# Patient Record
Sex: Female | Born: 1990 | Race: White | Hispanic: No | Marital: Single | Smoking: Never smoker
Health system: Southern US, Community
[De-identification: ages and names within clinical notes are randomized; demographics above are authoritative.]

## PROBLEM LIST (undated history)

## (undated) ENCOUNTER — Inpatient Hospital Stay: Payer: Self-pay

## (undated) DIAGNOSIS — I8290 Acute embolism and thrombosis of unspecified vein: Secondary | ICD-10-CM

## (undated) DIAGNOSIS — Z6841 Body Mass Index (BMI) 40.0 and over, adult: Secondary | ICD-10-CM

## (undated) DIAGNOSIS — A419 Sepsis, unspecified organism: Secondary | ICD-10-CM

## (undated) DIAGNOSIS — D649 Anemia, unspecified: Secondary | ICD-10-CM

## (undated) DIAGNOSIS — N390 Urinary tract infection, site not specified: Secondary | ICD-10-CM

## (undated) DIAGNOSIS — J45909 Unspecified asthma, uncomplicated: Secondary | ICD-10-CM

## (undated) HISTORY — DX: Unspecified asthma, uncomplicated: J45.909

## (undated) HISTORY — DX: Acute embolism and thrombosis of unspecified vein: I82.90

## (undated) HISTORY — PX: HAND SURGERY: SHX662

## (undated) HISTORY — DX: Urinary tract infection, site not specified: N39.0

## (undated) HISTORY — DX: Body Mass Index (BMI) 40.0 and over, adult: Z684

## (undated) HISTORY — DX: Sepsis, unspecified organism: A41.9

## (undated) HISTORY — DX: Anemia, unspecified: D64.9

---
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
## 2011-04-12 ENCOUNTER — Observation Stay: Payer: Self-pay

## 2011-05-05 ENCOUNTER — Encounter: Payer: Self-pay | Admitting: Obstetrics and Gynecology

## 2011-06-02 ENCOUNTER — Encounter: Payer: Self-pay | Admitting: Maternal & Fetal Medicine

## 2011-06-26 ENCOUNTER — Observation Stay: Payer: Self-pay | Admitting: Obstetrics and Gynecology

## 2011-07-06 ENCOUNTER — Observation Stay: Payer: Self-pay

## 2011-07-15 ENCOUNTER — Inpatient Hospital Stay: Payer: Self-pay | Admitting: Obstetrics and Gynecology

## 2011-07-19 ENCOUNTER — Observation Stay: Payer: Self-pay | Admitting: Emergency Medicine

## 2012-11-08 ENCOUNTER — Emergency Department: Payer: Self-pay | Admitting: Internal Medicine

## 2013-12-06 ENCOUNTER — Emergency Department: Payer: Self-pay | Admitting: Emergency Medicine

## 2013-12-06 LAB — URINALYSIS, COMPLETE
Bacteria: NONE SEEN
Bilirubin,UR: NEGATIVE
GLUCOSE, UR: NEGATIVE mg/dL (ref 0–75)
Nitrite: NEGATIVE
PH: 7 (ref 4.5–8.0)
Protein: 30
Specific Gravity: 1.012 (ref 1.003–1.030)

## 2013-12-06 LAB — COMPREHENSIVE METABOLIC PANEL
ALK PHOS: 53 U/L
ALT: 37 U/L (ref 12–78)
Albumin: 3.7 g/dL (ref 3.4–5.0)
Anion Gap: 5 — ABNORMAL LOW (ref 7–16)
BUN: 6 mg/dL — ABNORMAL LOW (ref 7–18)
Bilirubin,Total: 0.6 mg/dL (ref 0.2–1.0)
CHLORIDE: 104 mmol/L (ref 98–107)
CREATININE: 0.65 mg/dL (ref 0.60–1.30)
Calcium, Total: 9.3 mg/dL (ref 8.5–10.1)
Co2: 25 mmol/L (ref 21–32)
EGFR (African American): >60
EGFR (Non-African Amer.): >60
Glucose: 83 mg/dL (ref 65–99)
Osmolality: 265 (ref 275–301)
Potassium: 3.3 mmol/L — ABNORMAL LOW (ref 3.5–5.1)
SGOT(AST): 20 U/L (ref 15–37)
Sodium: 134 mmol/L — ABNORMAL LOW (ref 136–145)
TOTAL PROTEIN: 8.4 g/dL — AB (ref 6.4–8.2)

## 2013-12-06 LAB — CBC WITH DIFFERENTIAL/PLATELET
BASOS ABS: 0.1 10*3/uL (ref 0.0–0.1)
Basophil %: 0.6 %
Eosinophil #: 0.1 10*3/uL (ref 0.0–0.7)
Eosinophil %: 0.7 %
HCT: 40.8 % (ref 35.0–47.0)
HGB: 13.5 g/dL (ref 12.0–16.0)
LYMPHS ABS: 2.4 10*3/uL (ref 1.0–3.6)
Lymphocyte %: 23.1 %
MCH: 26.5 pg (ref 26.0–34.0)
MCHC: 33.2 g/dL (ref 32.0–36.0)
MCV: 80 fL (ref 80–100)
MONO ABS: 0.5 x10 3/mm (ref 0.2–0.9)
Monocyte %: 4.8 %
Neutrophil #: 7.3 10*3/uL — ABNORMAL HIGH (ref 1.4–6.5)
Neutrophil %: 70.8 %
Platelet: 254 10*3/uL (ref 150–440)
RBC: 5.1 10*6/uL (ref 3.80–5.20)
RDW: 14.7 % — AB (ref 11.5–14.5)
WBC: 10.4 10*3/uL (ref 3.6–11.0)

## 2013-12-06 LAB — HCG, QUANTITATIVE, PREGNANCY: BETA HCG, QUANT.: 32502 m[IU]/mL — AB

## 2013-12-06 LAB — LIPASE, BLOOD: Lipase: 97 U/L (ref 73–393)

## 2013-12-07 LAB — WET PREP, GENITAL

## 2013-12-07 LAB — GC/CHLAMYDIA PROBE AMP

## 2014-01-23 ENCOUNTER — Encounter: Payer: Self-pay | Admitting: Obstetrics and Gynecology

## 2014-03-06 ENCOUNTER — Encounter: Payer: Self-pay | Admitting: Obstetrics and Gynecology

## 2014-03-27 ENCOUNTER — Encounter: Payer: Self-pay | Admitting: Obstetrics and Gynecology

## 2014-04-13 ENCOUNTER — Observation Stay: Payer: Self-pay

## 2014-04-13 LAB — URINALYSIS, COMPLETE
Bilirubin,UR: NEGATIVE
Blood: NEGATIVE
Glucose,UR: NEGATIVE mg/dL (ref 0–75)
Nitrite: NEGATIVE
Ph: 6 (ref 4.5–8.0)
Protein: 25
RBC,UR: 7 /HPF (ref 0–5)
Specific Gravity: 1.015 (ref 1.003–1.030)
Squamous Epithelial: 6
WBC UR: 36 /HPF (ref 0–5)

## 2014-04-14 LAB — GC/CHLAMYDIA PROBE AMP

## 2014-04-15 LAB — URINE CULTURE

## 2014-05-16 ENCOUNTER — Observation Stay: Payer: Self-pay | Admitting: Obstetrics and Gynecology

## 2014-05-22 ENCOUNTER — Encounter: Payer: Self-pay | Admitting: Obstetrics & Gynecology

## 2014-06-09 ENCOUNTER — Observation Stay: Payer: Self-pay

## 2014-06-16 ENCOUNTER — Observation Stay: Payer: Self-pay

## 2014-06-23 ENCOUNTER — Observation Stay: Payer: Self-pay

## 2014-06-26 ENCOUNTER — Encounter: Payer: Self-pay | Admitting: Obstetrics and Gynecology

## 2014-06-26 ENCOUNTER — Observation Stay: Payer: Self-pay | Admitting: Certified Nurse Midwife

## 2014-06-26 LAB — URINALYSIS, COMPLETE
BILIRUBIN, UR: NEGATIVE
Bacteria: NONE SEEN
Blood: NEGATIVE
Glucose,UR: NEGATIVE mg/dL (ref 0–75)
KETONE: NEGATIVE
Leukocyte Esterase: NEGATIVE
NITRITE: NEGATIVE
Ph: 7 (ref 4.5–8.0)
Protein: NEGATIVE
Specific Gravity: 1.014 (ref 1.003–1.030)
Squamous Epithelial: 2

## 2014-06-26 LAB — GC/CHLAMYDIA PROBE AMP

## 2014-06-27 LAB — BETA STREP CULTURE(ARMC)

## 2014-06-30 ENCOUNTER — Observation Stay: Payer: Self-pay

## 2014-07-07 ENCOUNTER — Observation Stay: Payer: Self-pay

## 2014-07-14 ENCOUNTER — Observation Stay: Payer: Self-pay

## 2014-07-20 ENCOUNTER — Observation Stay: Payer: Self-pay | Admitting: Obstetrics and Gynecology

## 2014-07-21 ENCOUNTER — Inpatient Hospital Stay: Payer: Self-pay

## 2014-07-21 LAB — CBC WITH DIFFERENTIAL/PLATELET
BASOS ABS: 0 10*3/uL (ref 0.0–0.1)
Basophil %: 0.2 %
EOS ABS: 0 10*3/uL (ref 0.0–0.7)
Eosinophil %: 0 %
HCT: 34.7 % — ABNORMAL LOW (ref 35.0–47.0)
HGB: 11.2 g/dL — ABNORMAL LOW (ref 12.0–16.0)
Lymphocyte #: 1 10*3/uL (ref 1.0–3.6)
Lymphocyte %: 6.6 %
MCH: 26.5 pg (ref 26.0–34.0)
MCHC: 32.3 g/dL (ref 32.0–36.0)
MCV: 82 fL (ref 80–100)
Monocyte #: 0.5 x10 3/mm (ref 0.2–0.9)
Monocyte %: 3.1 %
Neutrophil #: 13.6 10*3/uL — ABNORMAL HIGH (ref 1.4–6.5)
Neutrophil %: 90.1 %
PLATELETS: 214 10*3/uL (ref 150–440)
RBC: 4.23 10*6/uL (ref 3.80–5.20)
RDW: 14.1 % (ref 11.5–14.5)
WBC: 15.1 10*3/uL — ABNORMAL HIGH (ref 3.6–11.0)

## 2014-07-21 LAB — GC/CHLAMYDIA PROBE AMP

## 2014-07-22 LAB — HEMATOCRIT: HCT: 31 % — AB (ref 35.0–47.0)

## 2014-12-23 NOTE — H&P (Signed)
L&D Evaluation:  History:  HPI -CC: fetal decel on dopplers at [facility] -HPI: 24 y/o G3P2002 [facility] here for above CC. Preg c/b elevated BMI, G2 c-section.  No decreased FM, UCs.   Patient's Surgical History Previous C-Section   Medications Pre [place]atal Vitamins   Allergies NKDA   Social History none   Family History no female cancers   Exam:  Vital Signs AF VS normal and stable   General no apparent distress   Mental Status clear   Chest ctab   Heart rrr no mrgs   Abdomen gravid, obese, soft, NTTP, non distended, +BS   Mebranes Intact   FHT 135 baseline, +accels, no decels, mod var   Ucx absent   Impression:  Impression patient and fetus doing well   Plan:  Comments *IUP: rNST -28wk labs done today *Decel: continue EFM for 30 more mins (90mins total) and if still looks great okay for d/c to home. Fetal kick count instructions given.  RTC [facility] 10/16 (pt told to keep appt)   Follow Up Appointment already scheduled   Electronic Signatures: [place] BingPickens, Rachel Clayton (MD)  (Signed 02-Oct-15 12:54)  Authored: L&D Evaluation   Last Updated: 02-Oct-15 12:54 by [place] BingPickens, Rachel Clayton (MD)

## 2014-12-23 NOTE — H&P (Signed)
L&D Evaluation:  History:  HPI 24 year old G3 P2002 with EDC=07/31/2014 by a 6wk2d ultrasound (and 08/05/2014 by LMP=10/29/2013-[facility] using this EDC?) presents at 24 3/7 weeks with c/o contractions since 4 PM. Contraction tightening and cramping less than 6x/hour. No bleeding or LOF. Denies vomiting, diarrhea, dysuria, vulvar itching or irritation. Also c/o constant mid thoracic (tenderness over spine).  Has not eaten all day. Drank 1 cup of water all day. "had to bail (her significant other) out of jail today and did not have time to eat. Baby moving better since she arrived on L&D. Denies any trauma precipitating back pain.  Prenatal care at [facility] remarkable for obesity with BMI>43, a UTI in April, asthma, and a hx of prior CS with G2 for failure of descent.   Presents with back pain, contractions   Patient's Medical History Asthma  Obesity   Patient's Surgical History Previous C-Section   Medications Pre Natal Vitamins   Allergies NKDA   Social History none  Last MJ in April   Family History Non-Contributory   ROS:  ROS see HPI   Exam:  Vital Signs stable  129/76   Urine Protein sp grav=1.015, +1 ketone, tr protein, 3+ leuks, 7 RBC/HPF, 36 WBC/HPF, 6epi cells   General no apparent distress   Mental Status clear   Abdomen gravid, non-tender, obese   Back no CVAT, some tenderness of paraspinal muscles in thoracic area   Pelvic cervix closed and thick, inflammed vulva and inner thighs and introitus. Wet prep positive for hyphae, negative for Trich or clue cells.  Cx: Closed/OOP   Mebranes Intact   FHT normal rate with no decels, FHTs 145   Ucx absent   Impression:  Impression IUP at 24 3/7 weeks with BH contractions.  Monilia. VV. Possible UTI   Plan:  Plan Urine culture pending. StaRT kEFLEX 500 MGM TID. dIFLUCAN 150 MGM Q 3 DAYS X 2.  Aptima sent.  encouraged to eat and drink fluids. FU at [facility] this week as scheduled.   Electronic Signatures: Trinna BalloonGutierrez, [place] Mielke L  (CNM)  (Signed 30-Aug-15 21:25)  Authored: L&D Evaluation   Last Updated: 30-Aug-15 21:25 by Trinna BalloonGutierrez, Kharis Lapenna L (CNM)

## 2014-12-23 NOTE — H&P (Signed)
L&D Evaluation:  History:  HPI Pt is a 24 yo G3P2002  at 8318w6d by an EDC of 08/05/14 by LMP=6wk2d ultrasound followed at [facility] who presents to L&D today after being sent from the office by the sonographer following her AFI because of her regular contractions that are increasing in frequency and because the "baby is in the birth canal."  She reports +FM, and bloody show. She denies lof.   The patient has been having weekly antenatal testing for BMI=43. Prenatal care at [facility] also remarkable for, a UTI in April/Sept/Oct/and November, asthma, leiomyoma, mild fatty liver,  and a hx of  a VAD with G1, and prior CS with G2 for failure of descent. Had oligo at 33-34 weeks but BPP of 10/10. She is A+, RI, VI, GBS positive, and received her Tdap and flu vaccine.   Presents with contractions   Patient's Medical History fatty liver, leiomyoma, UTI, asthma obesity   Patient's Surgical History Previous C-Section   Medications Pre Natal Vitamins   Allergies NKDA   Social History MJ use- last in april   Family History son with pulmonary stenosis   ROS:  ROS see HPI   Exam:  Vital Signs stable   Urine Protein 4+   General no apparent distress   Mental Status clear   Chest clear   Abdomen gravid, tender with contractions   Estimated Fetal Weight Average for gestational age   Fetal Position cephalic   Back no CVAT   Edema no edema   Pelvic no external lesions, 5/75/-2   Mebranes Intact   FHT 130's, moderate variability, +accels, 1-2 variables noted   FHT Description mod variability   Ucx regular, every 3-4   Skin dry, no lesions, no rashes   Lymph no lymphadenopathy   Impression:  Impression active labor, IUP at 37 6/7 weeks presenting in labor in the setting of prior C-section   Plan:  Plan EFM/NST, monitor contractions and for cervical change, antibiotics for GBBS prophylaxis   Follow Up Appointment need to schedule   Electronic Signatures: Jannet MantisSubudhi, Merlin Ege  (CNM)  (Signed 07-Dec-15 09:39)  Authored: L&D Evaluation   Last Updated: 07-Dec-15 09:39 by Jannet MantisSubudhi, Saria Haran (CNM)

## 2014-12-23 NOTE — H&P (Signed)
L&D Evaluation:  History:  HPI Pt is a 24 yo G3P2002  at 9312w5d by an EDC of 08/05/14 by LMP=6wk2d ultrasound followed at [facility] who presents to L&D at today for contractions starting this evening. No LOF, no VB, no ctx  The patient has been having weekly antenatal testing for BMI=43. Prenatal care at [facility] also remarkable for, a UTI in April/Sept/Oct/and November, asthma, leiomyoma, mild fatty liver,  and a hx of prior CS with G2 for failure of descent. Had oligo at 33-34 weeks but BPP of 10/10. She is A+, RI, VI,  and received her Tdap and flu vaccine.   Presents with contractions   Patient's Medical History fatty liver, leiomyoma, UTI, asthma obesity   Patient's Surgical History Previous C-Section   Medications Pre Natal Vitamins   Allergies NKDA   Social History MJ use- last in april   Family History son with pulmonary stenosis   ROS:  ROS see HPI   Exam:  Urine Protein 4+   General no apparent distress   Mental Status clear   Chest clear   Abdomen gravid, non-tender   Estimated Fetal Weight Average for gestational age   Fetal Position cephalic   Back no CVAT   Edema no edema   Pelvic no external lesions, 4cm per nursing check   Mebranes Intact   FHT normal rate with no decels   FHT Description mod variability   Ucx irregular, q5-68min   Skin dry   Impression:  Impression IUP at 37 5/7 weeks presenting for r/o labor in the setting of prior C-section   Plan:  Comments 1) R/O labor - recheck in 1-2hrs to see if any cervical change  2) Fetus - category I tracing  3) Disposition - pending cervical recheck   Electronic Signatures for Addendum Section:  Lorrene ReidStaebler, Tayvin Preslar M (MD) (Signed Addendum 06-Dec-15 19:07)  Patient actually expresses interest in attempting TOLAC.  She has a prior successfull vacuum assisted vaginal delivery of a 7lbs 8oz infant, followed by a LTCS for active phase arrest with Dr. Jean RosenthalJackson.  Given that she does have a tested pelvis,  this fetus is likely smaller I feel this a reasonable request.  We discussed that there is a 1% risk of uterine rupture with TOLAC necessitating emergent C-section with potential fetal compromise or harm.  Lorrene ReidStaebler, Vilma Will M (MD) (Signed Addendum 06-Dec-15 19:42)  Recheck 1.5hrs was 4/75/-3 posterior but stretchy.  Will keep patient for prolonged rule out   Electronic Signatures: Lorrene ReidStaebler, Camey Edell M (MD)  (Signed 06-Dec-15 18:24)  Authored: L&D Evaluation   Last Updated: 06-Dec-15 19:42 by Lorrene ReidStaebler, Jonpaul Lumm M (MD)

## 2014-12-23 NOTE — H&P (Signed)
L&D Evaluation:  History:  HPI Pt is a 24 yo G3P2002 at 34.[redacted] weeks GA with an EDC of 08/05/14 pt of [facility] who presents to L&D after her [facility] perinatal appointment for monitoring and delivery due to an AFI of 4 in the office today. She reports +FM, denies vb, lof or ctx. She denies any gush of fluid and reports last eating at 7:30am. Prenatal care at [facility] remarkable for obesity with BMI>43, a UTI in April, asthma, leiomyoma, and a hx of prior CS with G2 for failure of descent. She is A+, RI, VI, GBS unknown, and received her Tdap and flu vaccine.   Presents with other, oligohydramnios   Patient's Medical History fatty liver, leiomyoma, UTI, asthma obesity   Patient's Surgical History Previous C-Section   Medications Pre Natal Vitamins   Allergies NKDA   Social History MJ use- last in april   Family History Non-Contributory   ROS:  ROS All systems were reviewed.  HEENT, CNS, GI, GU, Respiratory, CV, Renal and Musculoskeletal systems were found to be normal.   Exam:  Vital Signs stable   General no apparent distress   Mental Status clear   Chest clear   Abdomen gravid, non-tender   Fetal Position cephalic   Back no CVAT   Mebranes Intact, neg ferning, neg nitrazine, neg pooling   FHT normal rate with no decels, 130, moderate variability, +accels, no decels   Ucx irregular   Skin dry, no lesions, no rashes   Lymph no lymphadenopathy   Impression:  Impression reactive NST, IUP at 34.2 oligohydramnios, morbid obesity, previous c/s   Plan:  Plan EFM/NST, fluids, BPP to be performed, probable repeat c/s for oligohydramnios and history of previous cesarean section   Comments BPP findings: 10/10, amniotic fluid pocket 3.12 x 2.75cm.   1L LR given Ucx sent GCCT sent On SSE, copious thick, green mucousy discharge - pH <4.5, no organisms seen on wet mount.  [facility] does not do NAAT for trichamonis.  Will ask to be collected and send at next office visit. Patient sees [facility]  for prenatal care, has appointment tomorrow and scheuduled NST Monday.  [facility] weekly [facility] for now.   Follow Up Appointment already scheduled   Electronic Signatures: Jannet MantisSubudhi, Valerya Maxton (CNM)  (Signed 12-Nov-15 10:54)  Authored: L&D Evaluation Ward, Elenora Fenderhelsea C (MD)  (Signed 12-Nov-15 13:22)  Authored: L&D Evaluation   Last Updated: 12-Nov-15 13:22 by Ward, Elenora Fenderhelsea C (MD)

## 2014-12-23 NOTE — H&P (Signed)
L&D Evaluation:  History:  HPI Pt is a 24 yo G3P2002  with an EDC of 08/05/14 by LMP=10/29/2013 and c/w 6wk2d ultrasound (EDC=12/17) and pt of [facility] who presents to L&D at 36.[redacted]weeks gestation for a NST/AFI. Having weekly antenatal testing for BMI=43. She reports +FM, denies vb, lof or ctx. Prenatal care at [facility] remarkable for obesity with BMI>43, a UTI in April/Sept/Oct/and November, asthma, leiomyoma, mild fatty liver,  and a hx of prior CS with G2 for failure of descent. Had oligo at 33-34 weeks but BPP of 10/10. She is A+, RI, VI,  and received her Tdap and flu vaccine.   Patient's Medical History fatty liver, leiomyoma, UTI, asthma obesity   Patient's Surgical History Previous C-Section   Medications Pre Natal Vitamins   Allergies NKDA   Social History MJ use- last in april   Family History son with pulmonary stenosis   ROS:  ROS see HPI   Exam:  Vital Signs 107/61   Urine Protein not completed   General no apparent distress   Abdomen gravid, non-tender   Fetal Position cephalic   Mebranes Intact, AFI=2.34+2.57+2.22cm=7.13cm   FHT normal rate with no decels, 135 baseline with accels to 170s   FHT Description mod variability   Ucx absent   Skin dry   Impression:  Impression IUP at 36 6/7 weeks with reactive NST and normaL afi   Plan:  Plan RTN in 1 week for NST/AFI. Repeat CS has been scheduled for 12/15. Daily FKCs   Electronic Signatures: Trinna BalloonGutierrez, Dashay Giesler L (CNM)  (Signed [phone]-Nov-15 23:03)  Authored: L&D Evaluation   Last Updated: 30-Nov-15 23:03 by Trinna BalloonGutierrez, Barbarann Kelly L (CNM)

## 2015-08-18 ENCOUNTER — Emergency Department
Admission: EM | Admit: 2015-08-18 | Discharge: 2015-08-18 | Disposition: A | Discharge status: Home / Self Care | Payer: Medicaid Other | Attending: Emergency Medicine | Admitting: Emergency Medicine

## 2015-08-18 ENCOUNTER — Encounter: Payer: Self-pay | Admitting: Emergency Medicine

## 2015-08-18 DIAGNOSIS — R51 Headache: Secondary | ICD-10-CM | POA: Diagnosis present

## 2015-08-18 DIAGNOSIS — N390 Urinary tract infection, site not specified: Secondary | ICD-10-CM | POA: Diagnosis not present

## 2015-08-18 DIAGNOSIS — Z3202 Encounter for pregnancy test, result negative: Secondary | ICD-10-CM | POA: Insufficient documentation

## 2015-08-18 DIAGNOSIS — R112 Nausea with vomiting, unspecified: Secondary | ICD-10-CM | POA: Insufficient documentation

## 2015-08-18 LAB — URINALYSIS COMPLETE WITH MICROSCOPIC (ARMC ONLY)
BILIRUBIN URINE: NEGATIVE
GLUCOSE, UA: NEGATIVE mg/dL
Hgb urine dipstick: NEGATIVE
NITRITE: NEGATIVE
PH: 6 (ref 5.0–8.0)
Protein, ur: 30 mg/dL — AB
Specific Gravity, Urine: 1.021 (ref 1.005–1.030)

## 2015-08-18 LAB — INFLUENZA PANEL BY PCR (TYPE A & B)
H1N1 flu by pcr: NOT DETECTED
Influenza A By PCR: NEGATIVE
Influenza B By PCR: NEGATIVE

## 2015-08-18 LAB — POCT PREGNANCY, URINE: Preg Test, Ur: NEGATIVE

## 2015-08-18 MED ORDER — IBUPROFEN 800 MG PO TABS
ORAL_TABLET | ORAL | Status: AC
  Filled 2015-08-18: qty 1

## 2015-08-18 MED ORDER — IBUPROFEN 800 MG PO TABS
800.0000 mg | ORAL_TABLET | Freq: Once | ORAL | Status: AC
  Administered 2015-08-18: 800 mg via ORAL

## 2015-08-18 MED ORDER — SULFAMETHOXAZOLE-TRIMETHOPRIM 800-160 MG PO TABS: 1.0000 | ORAL_TABLET | Freq: Two times a day (BID) | ORAL | Status: DC

## 2015-08-18 NOTE — ED Notes (Addendum)
States she developed body aches   Fever/chills  Vomited times 2  Since last pm also last period was in Nov.

## 2015-08-18 NOTE — Discharge Instructions (Signed)
[facility] any [facility]. Increase fluids.  Begin taking Bactrim DS twice a day for 10 days and you may take Tylenol or ibuprofen as needed for fever or muscle aches.

## 2015-08-18 NOTE — ED Notes (Signed)
Pt reports to ED w/ c/o n/v, headache and fever.  Pt sts that S/S began 3 days ago.  Pt sts that she has thrown up twice in last 48 hours

## 2015-08-18 NOTE — ED Provider Notes (Signed)
[facility] Emergency Department Provider Note  ____________________________________________  Time seen: Approximately 1:50 PM  I have reviewed the triage vital signs and the nursing notes.   HISTORY  Chief Complaint Influenza   HPI Rachel Clayton is a 25 y.o. female is here with complaint of headache, fever, body aches. She states this started 2 days ago and she has vomited twice in the last 48 hours. Patient states last time she vomited was approximately one hour ago when she drank some water. She also states that her last period was in November. She denies any abdominal pain, urinary symptoms, throat pain or cough. Currently she is not taking any over-the-counter medications. As her pain as 5 out of 10 at present. She is unaware of any exposure to influenza and has not traveled recently.   History reviewed. No pertinent past medical history.  There are no active problems to display for this patient.   History reviewed. No pertinent past surgical history.  Current Outpatient Rx  Name  Route  Sig  Dispense  Refill  . sulfamethoxazole-trimethoprim (BACTRIM DS,SEPTRA DS) 800-160 MG tablet   Oral   Take 1 tablet by mouth 2 (two) times daily.   20 tablet   0     Allergies Review of patient's allergies indicates no known allergies.  No family history on file.  Social History Social History  Substance Use Topics  . Smoking status: Never Smoker   . Smokeless tobacco: None  . Alcohol Use: No    Review of Systems Constitutional: Positive fever/chills Eyes: No visual changes. ENT: No sore throat. Cardiovascular: Denies chest pain. Respiratory: Denies shortness of breath. Gastrointestinal: No abdominal pain.  Positive nausea, positive vomiting.  No diarrhea.  No constipation. Genitourinary: Negative for dysuria. Musculoskeletal: Negative for back pain. Positive for muscle aches. Skin: Negative for rash. Neurological: Positive for headaches, no  focal weakness or numbness.  10-point ROS otherwise negative.  ____________________________________________   PHYSICAL EXAM:  VITAL SIGNS: ED Triage Vitals  Enc Vitals Group     BP 08/18/15 1323 132/94 mmHg     Pulse Rate 08/18/15 1323 120     Resp 08/18/15 1323 18     Temp 08/18/15 1323 100.9 F (38.3 C)     Temp Source 08/18/15 1323 Oral     SpO2 08/18/15 1323 98 %     Weight 08/18/15 1323 240 lb (108.863 kg)     Height 08/18/15 1323 5\' 6"  (1.676 m)     Head Cir --      Peak Flow --      Pain Score 08/18/15 1335 5     Pain Loc --      Pain Edu? --      Excl. in GC? --     Constitutional: Alert and oriented. Well appearing and in no acute distress. Eyes: Conjunctivae are normal. PERRL. EOMI. Head: Atraumatic. Nose: No congestion/rhinnorhea. EACs and TMs are clear bilaterally. Mouth/Throat: Mucous membranes are moist.  Oropharynx non-erythematous. Neck: No stridor.   Hematological/Lymphatic/Immunilogical: No cervical lymphadenopathy. Cardiovascular: Normal rate, regular rhythm. Grossly normal heart sounds.  Good peripheral circulation. Respiratory: Normal respiratory effort.  No retractions. Lungs CTAB. Gastrointestinal: Soft and nontender. No distention. No CVA tenderness. Musculoskeletal: Moves upper and lower extremities without any difficulty. Normal gait was noted. Neurologic:  Normal speech and language. No gross focal neurologic deficits are appreciated. No gait instability. Skin:  Skin is warm, dry and intact. No rash noted. Psychiatric: Mood and affect are normal. Speech  and behavior are normal.  ____________________________________________   LABS (all labs ordered are listed, but only abnormal results are displayed)  Labs Reviewed  URINALYSIS COMPLETEWITH MICROSCOPIC ([facility] ONLY) - Abnormal; Notable for the following:    Color, Urine Svara (*)    APPearance HAZY (*)    Ketones, ur 1+ (*)    Protein, ur 30 (*)    Leukocytes, UA TRACE (*)    Bacteria,  UA RARE (*)    Squamous Epithelial / LPF 6-30 (*)    All other components within normal limits  INFLUENZA PANEL BY PCR (TYPE A & B, H1N1)  POC URINE PREG, ED  POCT PREGNANCY, URINE    PROCEDURES  Procedure(s) performed: None  Critical Care performed: No  ____________________________________________   INITIAL IMPRESSION / ASSESSMENT AND PLAN / ED COURSE  Pertinent labs & imaging results that were available during my care of the patient were reviewed by me and considered in my medical decision making (see chart for details).  Patient was made aware that her influenza test was negative. She was given antipyretics while she was in the emergency room. Patient received prescription for Bactrim DS 1 twice a day for 10 days and encouraged to increase fluids for her urinary tract infection. ____________________________________________   FINAL CLINICAL IMPRESSION(S) / ED DIAGNOSES  Final diagnoses:  Acute urinary tract infection      Tommi RumpsRhonda L Karver Fadden, PA-C 08/18/15 1847  Jeanmarie PlantJames A McShane, MD 08/20/15 2017

## 2016-08-15 NOTE — L&D Delivery Note (Signed)
Delivery Note At 1:12 AM a viable female infant was delivered via Vaginal, Spontaneous (Presentation: ROA).  APGAR: 8, 9 ; weight pending.   Placenta status: delivered spontaneously, intact.  Cord: 3 VC with the following complications: none.  Cord pH: n/a  Anesthesia: epidural  Episiotomy:  none Lacerations:  none Suture Repair: n/a Est. Blood Loss (mL):  300 mL  Mom to postpartum.  Baby to Couplet care / Skin to Skin.  Called to see patient.  Mom pushed to deliver a viable female infant.  The head followed by shoulders, which delivered without difficulty, and the rest of the body.  A single nuchal cord noted and was delivered through and reduced at the perineum.  Baby to mom's chest.  Cord clamped and cut after > 1 min delay.  No cord blood obtained.  Placenta delivered spontaneously, intact, with a 3-vessel cord.  No vaginal, cervical, or perineal lacerations.  All counts correct.  Hemostasis obtained with IV pitocin and fundal massage. EBL 300 mL.     Thomasene MohairStephen Danarius Mcconathy, MD 07/16/2017, 1:21 AM

## 2016-11-24 ENCOUNTER — Emergency Department: Payer: Medicaid Other

## 2016-11-24 ENCOUNTER — Emergency Department
Admission: EM | Admit: 2016-11-24 | Discharge: 2016-11-24 | Disposition: A | Discharge status: Home / Self Care | Payer: Medicaid Other | Attending: Emergency Medicine | Admitting: Emergency Medicine

## 2016-11-24 DIAGNOSIS — O26891 Other specified pregnancy related conditions, first trimester: Secondary | ICD-10-CM | POA: Diagnosis present

## 2016-11-24 DIAGNOSIS — Z3A01 Less than 8 weeks gestation of pregnancy: Secondary | ICD-10-CM | POA: Insufficient documentation

## 2016-11-24 DIAGNOSIS — N390 Urinary tract infection, site not specified: Secondary | ICD-10-CM

## 2016-11-24 DIAGNOSIS — O2341 Unspecified infection of urinary tract in pregnancy, first trimester: Secondary | ICD-10-CM | POA: Insufficient documentation

## 2016-11-24 DIAGNOSIS — R1031 Right lower quadrant pain: Secondary | ICD-10-CM

## 2016-11-24 DIAGNOSIS — Z3491 Encounter for supervision of normal pregnancy, unspecified, first trimester: Secondary | ICD-10-CM

## 2016-11-24 DIAGNOSIS — R102 Pelvic and perineal pain: Secondary | ICD-10-CM | POA: Diagnosis not present

## 2016-11-24 LAB — COMPREHENSIVE METABOLIC PANEL
ALK PHOS: 53 U/L (ref 38–126)
ALT: 21 U/L (ref 14–54)
AST: 22 U/L (ref 15–41)
Albumin: 4.5 g/dL (ref 3.5–5.0)
Anion gap: 8 (ref 5–15)
BILIRUBIN TOTAL: 0.5 mg/dL (ref 0.3–1.2)
BUN: 8 mg/dL (ref 6–20)
CALCIUM: 9.8 mg/dL (ref 8.9–10.3)
CHLORIDE: 105 mmol/L (ref 101–111)
CO2: 23 mmol/L (ref 22–32)
CREATININE: 0.63 mg/dL (ref 0.44–1.00)
Glucose, Bld: 91 mg/dL (ref 65–99)
Potassium: 3.5 mmol/L (ref 3.5–5.1)
Sodium: 136 mmol/L (ref 135–145)
TOTAL PROTEIN: 9 g/dL — AB (ref 6.5–8.1)

## 2016-11-24 LAB — URINALYSIS, COMPLETE (UACMP) WITH MICROSCOPIC
BILIRUBIN URINE: NEGATIVE
Glucose, UA: NEGATIVE mg/dL
Hgb urine dipstick: NEGATIVE
Ketones, ur: NEGATIVE mg/dL
NITRITE: NEGATIVE
PH: 6 (ref 5.0–8.0)
Protein, ur: 30 mg/dL — AB
SPECIFIC GRAVITY, URINE: 1.029 (ref 1.005–1.030)

## 2016-11-24 LAB — CBC
HCT: 42.2 % (ref 35.0–47.0)
Hemoglobin: 14.3 g/dL (ref 12.0–16.0)
MCH: 26.1 pg (ref 26.0–34.0)
MCHC: 33.8 g/dL (ref 32.0–36.0)
MCV: 77.2 fL — AB (ref 80.0–100.0)
Platelets: 287 10*3/uL (ref 150–440)
RBC: 5.46 MIL/uL — AB (ref 3.80–5.20)
RDW: 15.3 % — AB (ref 11.5–14.5)
WBC: 9.5 10*3/uL (ref 3.6–11.0)

## 2016-11-24 LAB — LIPASE, BLOOD: LIPASE: 21 U/L (ref 11–51)

## 2016-11-24 LAB — HCG, QUANTITATIVE, PREGNANCY: HCG, BETA CHAIN, QUANT, S: 29366 m[IU]/mL — AB (ref <5)

## 2016-11-24 MED ORDER — ACETAMINOPHEN 500 MG PO TABS
1000.0000 mg | ORAL_TABLET | Freq: Once | ORAL | Status: AC
  Administered 2016-11-24: 1000 mg via ORAL
  Filled 2016-11-24: qty 2

## 2016-11-24 MED ORDER — PRENATAL VITAMINS 0.8 MG PO TABS: 1.0000 | ORAL_TABLET | Freq: Every day | ORAL | 3 refills | Status: DC

## 2016-11-24 MED ORDER — NITROFURANTOIN MACROCRYSTAL 100 MG PO CAPS: 100.0000 mg | ORAL_CAPSULE | Freq: Two times a day (BID) | ORAL | 0 refills | Status: DC

## 2016-11-24 MED ORDER — ONDANSETRON 4 MG PO TBDP: 8.0000 mg | ORAL_TABLET | Freq: Once | ORAL | Status: DC

## 2016-11-24 MED ORDER — ONDANSETRON HCL 4 MG/2ML IJ SOLN
4.0000 mg | Freq: Once | INTRAMUSCULAR | Status: AC
  Administered 2016-11-24: 4 mg via INTRAVENOUS
  Filled 2016-11-24: qty 2

## 2016-11-24 MED ORDER — SODIUM CHLORIDE 0.9 % IV BOLUS (SEPSIS)
1000.0000 mL | Freq: Once | INTRAVENOUS | Status: AC
  Administered 2016-11-24: 1000 mL via INTRAVENOUS

## 2016-11-24 NOTE — Discharge Instructions (Signed)
Your MRI was unremarkable. Your urine test suggests that you may have a mild urinary tract infection. Take Macrobid to treat this. Follow up with primary care.

## 2016-11-24 NOTE — ED Notes (Signed)
Patient transported to MRI 

## 2016-11-24 NOTE — ED Triage Notes (Signed)
Pt c/o RLQ pain with N/V/D since yesterday. Pt states she is [redacted] weeks pregnant with a confirmed pregnancy test from the health department .

## 2016-11-24 NOTE — ED Provider Notes (Signed)
[facility] Emergency Department Provider Note  ____________________________________________  Time seen: Approximately 6:54 PM  I have reviewed the triage vital signs and the nursing notes.   HISTORY  Chief Complaint Abdominal Pain    HPI Rachel Clayton is a 26 y.o. female who complains of right lower quadrant abdominal pain with nausea vomiting diarrhea since yesterday. She reports that she is [redacted] weeks pregnant with a last menstrual period of 11/13/2016. She had a positive urine pregnancy test with the health Department earlier today according to the patient. Reports decreased appetite in the last day or 2 as well. Nausea. No diarrhea or constipation. No fever or chills. No prior surgeries. No prenatal care or prenatal vitamins yet.  Patient denies vaginal bleeding or discharge.   History reviewed. No pertinent past medical history.   There are no active problems to display for this patient.    Past Surgical History:  Procedure Laterality Date  . CESAREAN SECTION       Prior to Admission medications   Medication Sig Start Date End Date Taking? Authorizing Provider  nitrofurantoin (MACRODANTIN) 100 MG capsule Take 1 capsule (100 mg total) by mouth 2 (two) times daily. 11/24/16   Sharman Cheek, MD  Prenatal Multivit-Min-Fe-FA (PRENATAL VITAMINS) 0.8 MG tablet Take 1 tablet by mouth daily. 11/24/16   Sharman Cheek, MD  sulfamethoxazole-trimethoprim (BACTRIM DS,SEPTRA DS) 800-160 MG tablet Take 1 tablet by mouth 2 (two) times daily. 08/18/15   Tommi Rumps, PA-C     Allergies Patient has no known allergies.   No family history on file.  Social History Social History  Substance Use Topics  . Smoking status: Never Smoker  . Smokeless tobacco: Never Used  . Alcohol use No    Review of Systems  Constitutional:   No fever or chills.  ENT:   No sore throat. No rhinorrhea. Cardiovascular:   No chest pain. Respiratory:   No dyspnea or  cough. Gastrointestinal:   Right lower quadrant abdominal pain as above with vomiting and diarrhea Genitourinary:   Negative for dysuria or difficulty urinating. Musculoskeletal:   Negative for focal pain or swelling Neurological:   Negative for headaches 10-point ROS otherwise negative.  ____________________________________________   PHYSICAL EXAM:  VITAL SIGNS: ED Triage Vitals  Enc Vitals Group     BP 11/24/16 1459 (!) 150/100     Pulse Rate 11/24/16 1459 94     Resp 11/24/16 1459 18     Temp 11/24/16 1459 97.7 F (36.5 C)     Temp Source 11/24/16 1459 Oral     SpO2 11/24/16 1459 100 %     Weight 11/24/16 1500 247 lb (112 kg)     Height 11/24/16 1500  (1.575 m)     Head Circumference --      Peak Flow --      Pain Score 11/24/16 1459 5     Pain Loc --      Pain Edu? --      Excl. in GC? --     Vital signs reviewed, nursing assessments reviewed.   Constitutional:   Alert and oriented. Well appearing and in no distress. Eyes:   No scleral icterus. No conjunctival pallor. PERRL. EOMI.  No nystagmus. ENT   Head:   Normocephalic and atraumatic.   Nose:   No congestion/rhinnorhea. No septal hematoma   Mouth/Throat:   MMM, no pharyngeal erythema. No peritonsillar mass.    Neck:   No stridor. No SubQ emphysema. No  meningismus. Hematological/Lymphatic/Immunilogical:   No cervical lymphadenopathy. Cardiovascular:   RRR. Symmetric bilateral radial and DP pulses.  No murmurs.  Respiratory:   Normal respiratory effort without tachypnea nor retractions. Breath sounds are clear and equal bilaterally. No wheezes/rales/rhonchi. Gastrointestinal:   Soft With suprapubic and right lower quadrant tenderness, more pronounced at McBurney's point.. Non distended. There is no CVA tenderness.  No rebound, rigidity, or guarding. Genitourinary:   deferred Musculoskeletal:   Normal range of motion in all extremities. No joint effusions.  No lower extremity tenderness.  No  edema. Neurologic:   Normal speech and language.  CN 2-10 normal. Motor grossly intact. No gross focal neurologic deficits are appreciated.  Skin:    Skin is warm, dry and intact. No rash noted.  No petechiae, purpura, or bullae.  ____________________________________________    LABS (pertinent positives/negatives) (all labs ordered are listed, but only abnormal results are displayed) Labs Reviewed  COMPREHENSIVE METABOLIC PANEL - Abnormal; Notable for the following:       Result Value   Total Protein 9.0 (*)    All other components within normal limits  CBC - Abnormal; Notable for the following:    RBC 5.46 (*)    MCV 77.2 (*)    RDW 15.3 (*)    All other components within normal limits  URINALYSIS, COMPLETE (UACMP) WITH MICROSCOPIC - Abnormal; Notable for the following:    Color, Urine YELLOW (*)    APPearance CLOUDY (*)    Protein, ur 30 (*)    Leukocytes, UA SMALL (*)    Bacteria, UA RARE (*)    Squamous Epithelial / LPF 6-30 (*)    All other components within normal limits  HCG, QUANTITATIVE, PREGNANCY - Abnormal; Notable for the following:    hCG, Beta Chain, Quant, S 29,366 (*)    All other components within normal limits  URINE CULTURE  LIPASE, BLOOD   ____________________________________________   EKG    ____________________________________________    RADIOLOGY  Mr Pelvis Wo Contrast  Result Date: 11/24/2016 CLINICAL DATA:  Right lower quadrant pain with nausea vomiting and diarrhea since yesterday. Patient has confirmed pregnancy. EXAM: MRI ABDOMEN AND PELVIS WITHOUT CONTRAST TECHNIQUE: Multiplanar multisequence MR imaging of the abdomen and pelvis was performed. No intravenous contrast was administered. COMPARISON:  None. FINDINGS: COMBINED FINDINGS FOR BOTH MR ABDOMEN AND PELVIS Lower chest: Unremarkable. Hepatobiliary: Visualized portions of the liver are normal. Gallbladder has normal MR imaging appearance with some layering sludge in the lumen. No  intra or extrahepatic biliary duct dilatation. Pancreas: No focal mass lesion. No dilatation of the main duct. No intraparenchymal cyst. No peripancreatic edema. Spleen:  No splenomegaly. No focal mass lesion. Adrenals/Urinary Tract: No adrenal nodule or mass. 9 mm well-defined T1 hypointense, T2 hypointense cortical lesion upper pole left kidney cannot be definitively characterized but in a patient of this age is most likely a cyst complicated by proteinaceous debris or hemorrhage. There is no hydronephrosis in either kidney. No evidence for hydroureter. Stomach/Bowel: Stomach is nondistended. No gastric wall thickening. No evidence of outlet obstruction. Duodenum is normally positioned as is the ligament of Treitz. No small bowel dilatation. The terminal ileum is normal. The appendix is well-visualized and is normal. No gross colonic mass. No colonic wall thickening. No substantial diverticular change. Vascular/Lymphatic: No abdominal aortic aneurysm.There is no gastrohepatic or hepatoduodenal ligament lymphadenopathy. No intraperitoneal or retroperitoneal lymphadenopathy. No pelvic sidewall lymphadenopathy. Reproductive: Intrauterine gestational sac is visualized. 18 mm follicle or corpus luteum cyst noted left ovary. Right  ovary normal in appearance. Other:  Trace free fluid is seen in the cul-de-sac. Musculoskeletal: No abnormal marrow signal identified within the visualized bony anatomy. IMPRESSION: 1. No acute findings in the abdomen or pelvis. Specifically, the appendix is normal. Terminal ileum is normal. No right adnexal mass. 2. Very trace intraperitoneal free fluid noted in the cul-de-sac. Electronically Signed   By: Kennith Center M.D.   On: 11/24/2016 17:43   Mr Abdomen Wo Contrast  Result Date: 11/24/2016 CLINICAL DATA:  Right lower quadrant pain with nausea vomiting and diarrhea since yesterday. Patient has confirmed pregnancy. EXAM: MRI ABDOMEN AND PELVIS WITHOUT CONTRAST TECHNIQUE: Multiplanar  multisequence MR imaging of the abdomen and pelvis was performed. No intravenous contrast was administered. COMPARISON:  None. FINDINGS: COMBINED FINDINGS FOR BOTH MR ABDOMEN AND PELVIS Lower chest: Unremarkable. Hepatobiliary: Visualized portions of the liver are normal. Gallbladder has normal MR imaging appearance with some layering sludge in the lumen. No intra or extrahepatic biliary duct dilatation. Pancreas: No focal mass lesion. No dilatation of the main duct. No intraparenchymal cyst. No peripancreatic edema. Spleen:  No splenomegaly. No focal mass lesion. Adrenals/Urinary Tract: No adrenal nodule or mass. 9 mm well-defined T1 hypointense, T2 hypointense cortical lesion upper pole left kidney cannot be definitively characterized but in a patient of this age is most likely a cyst complicated by proteinaceous debris or hemorrhage. There is no hydronephrosis in either kidney. No evidence for hydroureter. Stomach/Bowel: Stomach is nondistended. No gastric wall thickening. No evidence of outlet obstruction. Duodenum is normally positioned as is the ligament of Treitz. No small bowel dilatation. The terminal ileum is normal. The appendix is well-visualized and is normal. No gross colonic mass. No colonic wall thickening. No substantial diverticular change. Vascular/Lymphatic: No abdominal aortic aneurysm.There is no gastrohepatic or hepatoduodenal ligament lymphadenopathy. No intraperitoneal or retroperitoneal lymphadenopathy. No pelvic sidewall lymphadenopathy. Reproductive: Intrauterine gestational sac is visualized. 18 mm follicle or corpus luteum cyst noted left ovary. Right ovary normal in appearance. Other:  Trace free fluid is seen in the cul-de-sac. Musculoskeletal: No abnormal marrow signal identified within the visualized bony anatomy. IMPRESSION: 1. No acute findings in the abdomen or pelvis. Specifically, the appendix is normal. Terminal ileum is normal. No right adnexal mass. 2. Very trace  intraperitoneal free fluid noted in the cul-de-sac. Electronically Signed   By: Kennith Center M.D.   On: 11/24/2016 17:43    ____________________________________________   PROCEDURES Procedures  ____________________________________________   INITIAL IMPRESSION / ASSESSMENT AND PLAN / ED COURSE  Pertinent labs & imaging results that were available during my care of the patient were reviewed by me and considered in my medical decision making (see chart for details).  Patient well appearing no acute distress, presents with right lower quadrant abdominal pain in very early pregnancy. Symptoms and urinalysis are consistent with urinary tract infection, but appendicitis is also on the dura fragile. Due to early pregnancy, an MRI was performed which was negative. We'll treat with Macrobid for 1 week, urine culture sent. Follow-up with primary care per gestational she'll follow-up with [facility] soon. Also counseled on prenatal vitamins. Low suspicion for STI or PID TOA or torsion.         ____________________________________________   FINAL CLINICAL IMPRESSION(S) / ED DIAGNOSES  Final diagnoses:  Abdominal pain, RLQ  Pregnant and not yet delivered in first trimester  Lower urinary tract infectious disease      New Prescriptions   NITROFURANTOIN (MACRODANTIN) 100 MG CAPSULE    Take 1 capsule (  100 mg total) by mouth 2 (two) times daily.   PRENATAL MULTIVIT-MIN-FE-FA (PRENATAL VITAMINS) 0.8 MG TABLET    Take 1 tablet by mouth daily.     Portions of this note were generated with dragon dictation software. Dictation errors may occur despite best attempts at proofreading.    Sharman Cheek, MD 11/24/16 [id number]

## 2016-11-26 LAB — URINE CULTURE

## 2016-12-14 ENCOUNTER — Ambulatory Visit (INDEPENDENT_AMBULATORY_CARE_PROVIDER_SITE_OTHER): Payer: Medicaid Other | Admitting: Advanced Practice Midwife

## 2016-12-14 ENCOUNTER — Encounter: Payer: Self-pay | Admitting: Advanced Practice Midwife

## 2016-12-14 VITALS — BP 120/80 | HR 98 | Wt 237.0 lb

## 2016-12-14 DIAGNOSIS — Z113 Encounter for screening for infections with a predominantly sexual mode of transmission: Secondary | ICD-10-CM

## 2016-12-14 DIAGNOSIS — O0990 Supervision of high risk pregnancy, unspecified, unspecified trimester: Secondary | ICD-10-CM | POA: Insufficient documentation

## 2016-12-14 DIAGNOSIS — B3731 Acute candidiasis of vulva and vagina: Secondary | ICD-10-CM

## 2016-12-14 DIAGNOSIS — E669 Obesity, unspecified: Secondary | ICD-10-CM

## 2016-12-14 DIAGNOSIS — Z124 Encounter for screening for malignant neoplasm of cervix: Secondary | ICD-10-CM

## 2016-12-14 DIAGNOSIS — Z3490 Encounter for supervision of normal pregnancy, unspecified, unspecified trimester: Secondary | ICD-10-CM

## 2016-12-14 DIAGNOSIS — IMO0001 Reserved for inherently not codable concepts without codable children: Secondary | ICD-10-CM

## 2016-12-14 DIAGNOSIS — B373 Candidiasis of vulva and vagina: Secondary | ICD-10-CM

## 2016-12-14 DIAGNOSIS — Z6841 Body Mass Index (BMI) 40.0 and over, adult: Secondary | ICD-10-CM

## 2016-12-14 DIAGNOSIS — Z1379 Encounter for other screening for genetic and chromosomal anomalies: Secondary | ICD-10-CM

## 2016-12-14 DIAGNOSIS — Z131 Encounter for screening for diabetes mellitus: Secondary | ICD-10-CM

## 2016-12-14 MED ORDER — FLUCONAZOLE 150 MG PO TABS: 150.0000 mg | ORAL_TABLET | Freq: Once | ORAL | 0 refills | Status: AC

## 2016-12-14 NOTE — Progress Notes (Signed)
New Obstetric Patient H&P    Chief Complaint: "Desires prenatal care"    History of Present Illness: Patient is a 26 y.o. G4P3 Not Hispanic or Latino female, LMP 11/09/2016 presents with amenorrhea and positive home pregnancy test. Based on her  LMP, her EDD is Estimated Date of Delivery: 08/16/17 and her EGA is [redacted]w[redacted]d. Cycles are 5. days, regular, and occur approximately every : 28 days. Her last pap smear was 1 years ago and was no abnormalities.    She had a urine pregnancy test which was positive 2 week(s)  ago. Her last menstrual period was normal and lasted for  5 day(s). Since her LMP she claims she has experienced breast tenderness, fatigue, nausea and vomiting. She denies vaginal bleeding. Her past medical history is noncontributory. Her prior pregnancies are notable for G1 SVD, G2 C/S, G3 VBAC  Since her LMP, she admits to the use of tobacco products  no She claims she has lost 8 pounds since the start of her pregnancy.  There are cats in the home in the home  no  She admits close contact with children on a regular basis  yes  She has had chicken pox in the past yes She has had Tuberculosis exposures, symptoms, or previously tested positive for TB   no Current or past history of domestic violence. no  Genetic Screening/Teratology Counseling: (Includes patient, baby's father, or anyone in either family with:)   1. Patient's age >/= 79 at [facility]  no 2. Thalassemia ([place], [place], Mediterranean, or Asian background): MCV<80  no 3. Neural tube defect (meningomyelocele, spina bifida, anencephaly)  no 4. Congenital heart defect  no  5. Down syndrome  no 6. Tay-Sachs (Jewish, [place]  no 7. Canavan's Disease  no 8. Sickle cell disease or trait (African)  no  9. Hemophilia or other blood disorders  no  10. Muscular dystrophy  no  11. Cystic fibrosis  no  12. Huntington's Chorea  no  13. Mental retardation/autism  no 14. Other inherited genetic or chromosomal disorder   no 15. Maternal metabolic disorder (DM, PKU, etc)  no 16. Patient or FOB with a child with a birth defect not listed above no  16a. Patient or FOB with a birth defect themselves no 17. Recurrent pregnancy loss, or stillbirth  no  18. Any medications since LMP other than prenatal vitamins (include vitamins, supplements, OTC meds, drugs, alcohol)  no 19. Any other genetic/environmental exposure to discuss  no  Infection History:   1. Lives with someone with TB or TB exposed  no  2. Patient or partner has history of genital herpes  no 3. Rash or viral illness since LMP  no 4. History of STI (GC, CT, HPV, syphilis, HIV)  no 5. History of recent travel :  no  Other pertinent information:  no     Review of Systems:10 point review of systems negative unless otherwise noted in HPI  Past Medical History:  Past Medical History:  Diagnosis Date  . Asthma   . BMI 40.0-44.9, adult (HCC)   . History of oligohydramnios   . Recurrent UTI     Past Surgical History:  Past Surgical History:  Procedure Laterality Date  . CESAREAN SECTION    . HAND SURGERY      Gynecologic History: Patient's last menstrual period was 11/09/2016.  Obstetric History: G4P3  Family History:  Family History  Problem Relation Age of Onset  . Hypertension Mother  Social History:  Social History   Social History  . Marital status: Single    Spouse name: N/A  . Number of children: N/A  . Years of education: N/A   Occupational History  . Not on file.   Social History Main Topics  . Smoking status: Never Smoker  . Smokeless tobacco: Never Used  . Alcohol use No  . Drug use: No  . Sexual activity: Yes   Other Topics Concern  . Not on file   Social History Narrative  . No narrative on file    Allergies:  No Known Allergies  Medications: Prior to Admission medications   Medication Sig Start Date End Date Taking? Authorizing Provider  fluconazole (DIFLUCAN) 150 MG tablet Take 1 tablet  (150 mg total) by mouth once. Can take additional dose three days later if symptoms persist 12/14/16 12/14/16  Tresea Mall, CNM    Physical Exam Vitals: Blood pressure 120/80, pulse 98, weight 237 lb (107.5 kg), last menstrual period 11/09/2016.  General: NAD HEENT: normocephalic, anicteric Thyroid: no enlargement, no palpable nodules Pulmonary: No increased work of breathing, CTAB Cardiovascular: RRR, distal pulses 2+ Abdomen: NABS, soft, non-tender, non-distended.  Umbilicus without lesions.  No hepatomegaly, splenomegaly or masses palpable. No evidence of hernia  Genitourinary:  External: Normal external female genitalia.  Normal urethral meatus, normal  Bartholin's and Skene's glands.    Vagina: Normal vaginal mucosa, no evidence of prolapse.    Cervix: Grossly normal in appearance, no bleeding, no CMT  Uterus: Enlarged, mobile, normal contour.    Adnexa: ovaries non-enlarged, no adnexal masses  Rectal: deferred Extremities: no edema, erythema, or tenderness Neurologic: Grossly intact Psychiatric: mood appropriate, affect full   Assessment: 26 y.o. G4P3 at [redacted]w[redacted]d presenting to initiate prenatal care  Plan: 1) Avoid alcoholic beverages. 2) Patient encouraged not to smoke.  3) Discontinue the use of all non-medicinal drugs and chemicals.  4) Take prenatal vitamins daily.  5) Nutrition, food safety (fish, cheese advisories, and high nitrite foods) and exercise discussed. 6) Hospital and practice style discussed with cross coverage system.  7) Genetic Screening, such as with 1st Trimester Screening, cell free fetal DNA, AFP testing, and Ultrasound, as well as with amniocentesis and CVS as appropriate, is discussed with patient. At the conclusion of today's visit patient declined genetic testing 8) Patient is asked about travel to areas at risk for the [place] virus, and counseled to avoid travel and exposure to mosquitoes or sexual partners who may have themselves been exposed to the  virus. Testing is discussed, and will be ordered as appropriate.    Tresea Mall, CNM

## 2016-12-14 NOTE — Patient Instructions (Signed)

## 2016-12-16 LAB — IGP,CTNGTV,RFX APTIMA HPV ASCU
Chlamydia, Nuc. Acid Amp: NEGATIVE
GONOCOCCUS, NUC. ACID AMP: NEGATIVE
PAP SMEAR COMMENT: 0
Trich vag by NAA: NEGATIVE

## 2016-12-16 LAB — URINE CULTURE

## 2016-12-30 ENCOUNTER — Ambulatory Visit (INDEPENDENT_AMBULATORY_CARE_PROVIDER_SITE_OTHER): Payer: Medicaid Other

## 2016-12-30 DIAGNOSIS — O0990 Supervision of high risk pregnancy, unspecified, unspecified trimester: Secondary | ICD-10-CM

## 2016-12-30 DIAGNOSIS — Z3490 Encounter for supervision of normal pregnancy, unspecified, unspecified trimester: Secondary | ICD-10-CM | POA: Diagnosis not present

## 2017-01-04 ENCOUNTER — Ambulatory Visit (INDEPENDENT_AMBULATORY_CARE_PROVIDER_SITE_OTHER): Payer: Medicaid Other | Admitting: Obstetrics and Gynecology

## 2017-01-04 ENCOUNTER — Other Ambulatory Visit: Payer: Medicaid Other

## 2017-01-04 VITALS — BP 114/68 | Wt 235.0 lb

## 2017-01-04 DIAGNOSIS — O0990 Supervision of high risk pregnancy, unspecified, unspecified trimester: Secondary | ICD-10-CM

## 2017-01-04 DIAGNOSIS — Z3A12 12 weeks gestation of pregnancy: Secondary | ICD-10-CM

## 2017-01-04 DIAGNOSIS — O34219 Maternal care for unspecified type scar from previous cesarean delivery: Secondary | ICD-10-CM

## 2017-01-04 DIAGNOSIS — O99210 Obesity complicating pregnancy, unspecified trimester: Secondary | ICD-10-CM

## 2017-01-04 NOTE — Patient Instructions (Signed)
Start low dose ASA at >[redacted] weeks gestation as per USPTF recommendation "Low-Dose Aspirin Use for the Prevention of Morbidity and Mortality From Preeclampsia: Preventive Medicine"  furthermore endorsed by ACOG, WHO, and NIH based on evidence level B for the prevention of preeclampsia  In women deemed high risk  (diabetes, renal disease, chronic hypertension, history of preeclampsia in prior gestation, autoimmune diseases, or multifetal gestations)  

## 2017-01-04 NOTE — Progress Notes (Signed)
Dating scan on 5/18

## 2017-01-05 LAB — COMPREHENSIVE METABOLIC PANEL
ALT: 14 IU/L (ref 0–32)
AST: 14 IU/L (ref 0–40)
Albumin/Globulin Ratio: 1.3 (ref 1.2–2.2)
Albumin: 4 g/dL (ref 3.5–5.5)
Alkaline Phosphatase: 49 IU/L (ref 39–117)
BUN/Creatinine Ratio: 9 (ref 9–23)
BUN: 5 mg/dL — AB (ref 6–20)
Bilirubin Total: 0.4 mg/dL (ref 0.0–1.2)
CALCIUM: 9.2 mg/dL (ref 8.7–10.2)
CO2: 19 mmol/L (ref 18–29)
Chloride: 100 mmol/L (ref 96–106)
Creatinine, Ser: 0.53 mg/dL — ABNORMAL LOW (ref 0.57–1.00)
GFR, EST AFRICAN AMERICAN: 154 mL/min/{1.73_m2} (ref >59)
GFR, EST NON AFRICAN AMERICAN: 133 mL/min/{1.73_m2} (ref >59)
GLUCOSE: 87 mg/dL (ref 65–99)
Globulin, Total: 3.2 g/dL (ref 1.5–4.5)
Potassium: 4 mmol/L (ref 3.5–5.2)
Sodium: 135 mmol/L (ref 134–144)
TOTAL PROTEIN: 7.2 g/dL (ref 6.0–8.5)

## 2017-01-05 LAB — RPR+RH+ABO+RUB AB+AB SCR+CB...
Antibody Screen: NEGATIVE
HEMATOCRIT: 37.6 % (ref 34.0–46.6)
HEP B S AG: NEGATIVE
HIV SCREEN 4TH GENERATION: NONREACTIVE
Hemoglobin: 12.7 g/dL (ref 11.1–15.9)
MCH: 27.3 pg (ref 26.6–33.0)
MCHC: 33.8 g/dL (ref 31.5–35.7)
MCV: 81 fL (ref 79–97)
Platelets: 248 10*3/uL (ref 150–379)
RBC: 4.65 x10E6/uL (ref 3.77–5.28)
RDW: 16.7 % — ABNORMAL HIGH (ref 12.3–15.4)
RH TYPE: POSITIVE
RPR Ser Ql: NONREACTIVE
Rubella Antibodies, IGG: <0.9 index — ABNORMAL LOW (ref >0.99)
VARICELLA: 657 {index} (ref >165)
WBC: 7.3 10*3/uL (ref 3.4–10.8)

## 2017-01-05 LAB — HEMOGLOBIN A1C
Est. average glucose Bld gHb Est-mCnc: 97 mg/dL
HEMOGLOBIN A1C: 5 % (ref 4.8–5.6)

## 2017-01-05 LAB — TSH: TSH: 2.47 u[IU]/mL (ref 0.450–4.500)

## 2017-01-05 LAB — PROTEIN / CREATININE RATIO, URINE
Creatinine, Urine: 201.6 mg/dL
PROTEIN UR: 29.6 mg/dL
Protein/Creat Ratio: 147 mg/g creat (ref 0–200)

## 2017-02-01 ENCOUNTER — Other Ambulatory Visit: Payer: Medicaid Other

## 2017-02-01 ENCOUNTER — Ambulatory Visit (INDEPENDENT_AMBULATORY_CARE_PROVIDER_SITE_OTHER): Payer: Medicaid Other | Admitting: Obstetrics and Gynecology

## 2017-02-01 VITALS — BP 124/70 | Wt 241.0 lb

## 2017-02-01 DIAGNOSIS — R7309 Other abnormal glucose: Secondary | ICD-10-CM

## 2017-02-01 DIAGNOSIS — O34219 Maternal care for unspecified type scar from previous cesarean delivery: Secondary | ICD-10-CM

## 2017-02-01 DIAGNOSIS — Z6841 Body Mass Index (BMI) 40.0 and over, adult: Secondary | ICD-10-CM | POA: Insufficient documentation

## 2017-02-01 DIAGNOSIS — O0990 Supervision of high risk pregnancy, unspecified, unspecified trimester: Secondary | ICD-10-CM

## 2017-02-01 DIAGNOSIS — Z3A16 16 weeks gestation of pregnancy: Secondary | ICD-10-CM

## 2017-02-01 DIAGNOSIS — Z1379 Encounter for other screening for genetic and chromosomal anomalies: Secondary | ICD-10-CM

## 2017-02-01 DIAGNOSIS — O99210 Obesity complicating pregnancy, unspecified trimester: Secondary | ICD-10-CM

## 2017-02-01 NOTE — Progress Notes (Signed)
Early 1 hr gtt today. No vb. No lof.  Labs reviewed. Patient already had lab draw for 1h gtt and did not want another needle stick today for 2nd trimester screen.  Explained test can still be done at 20 weeks, but is a bit better at 16 weeks. She states she is ok with waiting.

## 2017-02-02 LAB — HEMOGLOBINOPATHY EVALUATION
HEMOGLOBIN F QUANTITATION: 0 % (ref 0.0–2.0)
HGB C: 0 %
HGB S: 0 %
HGB VARIANT: 0 %
Hemoglobin A2 Quantitation: 2.4 % (ref 1.8–3.2)
Hgb A: 97.6 % (ref 96.4–98.8)

## 2017-02-02 LAB — GLUCOSE, 1 HOUR GESTATIONAL: Gestational Diabetes Screen: 141 mg/dL — ABNORMAL HIGH (ref 65–139)

## 2017-02-06 NOTE — Addendum Note (Signed)
Addended by: Tresea MallGLEDHILL, Jullia Mulligan on: 02/06/2017 12:50 PM   Modules accepted: Orders

## 2017-02-09 ENCOUNTER — Other Ambulatory Visit: Payer: Self-pay | Admitting: Advanced Practice Midwife

## 2017-02-17 ENCOUNTER — Other Ambulatory Visit: Payer: Medicaid Other

## 2017-02-17 DIAGNOSIS — R7309 Other abnormal glucose: Secondary | ICD-10-CM

## 2017-02-18 LAB — GESTATIONAL GLUCOSE TOLERANCE
GLUCOSE 2 HOUR GTT: 127 mg/dL (ref 65–154)
GLUCOSE 3 HOUR GTT: 110 mg/dL (ref 65–139)
GLUCOSE FASTING: 82 mg/dL (ref 65–94)
Glucose, GTT - 1 Hour: 150 mg/dL (ref 65–179)

## 2017-03-01 ENCOUNTER — Ambulatory Visit (INDEPENDENT_AMBULATORY_CARE_PROVIDER_SITE_OTHER): Payer: Medicaid Other

## 2017-03-01 ENCOUNTER — Ambulatory Visit (INDEPENDENT_AMBULATORY_CARE_PROVIDER_SITE_OTHER): Payer: Medicaid Other | Admitting: Obstetrics and Gynecology

## 2017-03-01 VITALS — BP 120/82 | Wt 246.0 lb

## 2017-03-01 DIAGNOSIS — O0990 Supervision of high risk pregnancy, unspecified, unspecified trimester: Secondary | ICD-10-CM | POA: Diagnosis not present

## 2017-03-01 DIAGNOSIS — Z362 Encounter for other antenatal screening follow-up: Secondary | ICD-10-CM | POA: Diagnosis not present

## 2017-03-01 DIAGNOSIS — Z3A20 20 weeks gestation of pregnancy: Secondary | ICD-10-CM

## 2017-03-01 NOTE — Progress Notes (Signed)
ROB Anatomy scan today 

## 2017-03-01 NOTE — Progress Notes (Signed)
Anatomy scan complete 

## 2017-03-08 ENCOUNTER — Encounter: Payer: Self-pay | Admitting: Obstetrics and Gynecology

## 2017-03-08 LAB — AFP TETRA
DIA Mom Value: 0.89
DIA VALUE (EIA): 132.76 pg/mL
DSR (By Age)    1 IN: 1026
DSR (SECOND TRIMESTER) 1 IN: 8892
GESTATIONAL AGE AFP: 20.1 wk
MSAFP Mom: 0.78
MSAFP: 33.1 ng/mL
MSHCG Mom: 0.78
MSHCG: 13514 m[IU]/mL
Maternal Age At EDD: 25.1 yr
OSB RISK: 10000
Test Results:: NEGATIVE
UE3 MOM: 1.09
UE3 VALUE: 1.75 ng/mL
WEIGHT: 246 [lb_av]

## 2017-03-29 ENCOUNTER — Ambulatory Visit (INDEPENDENT_AMBULATORY_CARE_PROVIDER_SITE_OTHER): Payer: Medicaid Other | Admitting: Obstetrics and Gynecology

## 2017-03-29 VITALS — BP 118/76 | Wt 254.0 lb

## 2017-03-29 DIAGNOSIS — Z6841 Body Mass Index (BMI) 40.0 and over, adult: Secondary | ICD-10-CM

## 2017-03-29 DIAGNOSIS — O0990 Supervision of high risk pregnancy, unspecified, unspecified trimester: Secondary | ICD-10-CM

## 2017-03-29 DIAGNOSIS — Z3A24 24 weeks gestation of pregnancy: Secondary | ICD-10-CM

## 2017-03-29 DIAGNOSIS — Z131 Encounter for screening for diabetes mellitus: Secondary | ICD-10-CM

## 2017-03-29 DIAGNOSIS — O99210 Obesity complicating pregnancy, unspecified trimester: Secondary | ICD-10-CM

## 2017-03-29 DIAGNOSIS — Z113 Encounter for screening for infections with a predominantly sexual mode of transmission: Secondary | ICD-10-CM

## 2017-03-29 DIAGNOSIS — O34219 Maternal care for unspecified type scar from previous cesarean delivery: Secondary | ICD-10-CM

## 2017-03-29 NOTE — Progress Notes (Signed)
Routine Prenatal Care Visit  Subjective  Rachel Clayton is a 26 y.o. G4P3 at 4464w1d being seen today for ongoing prenatal care.  She is currently monitored for the following issues for this high-risk pregnancy and has Supervision of high risk pregnancy, antepartum; Maternal obesity, antepartum; History of cesarean section complicating pregnancy; and BMI 40.0-44.9, adult (HCC) on her problem list.  ----------------------------------------------------------------------------------- Patient reports no bleeding, no contractions, no cramping and no leaking.   Contractions: Not present. Vag. Bleeding: None.  Movement: Present. Denies leaking of fluid.  ----------------------------------------------------------------------------------- The following portions of the patient's history were reviewed and updated as appropriate: allergies, current medications, past family history, past medical history, past social history, past surgical history and problem list. Problem list updated.   Objective  Blood pressure 118/76, weight 254 lb (115.2 kg), last menstrual period 11/09/2016. Pregravid weight 245 lb (111.1 kg) Total Weight Gain 9 lb (4.082 kg) Urinalysis: Urine Protein: Negative Urine Glucose: Negative  Fetal Status: Fetal Heart Rate (bpm): 14   Movement: Present     General:  Alert, oriented and cooperative. Patient is in no acute distress.  Skin: Skin is warm and dry. No rash noted.   Cardiovascular: Normal heart rate noted  Respiratory: Normal respiratory effort, no problems with respiration noted  Abdomen: Soft, gravid, appropriate for gestational age. Pain/Pressure: Absent     Pelvic:  Cervical exam deferred        Extremities: Normal range of motion.     ental Status: Normal mood and affect. Normal behavior. Normal judgment and thought content.     Assessment   26 y.o. G4P3 at 5364w1d by  07/18/2017, by Ultrasound presenting for routine prenatal visit  Plan   pregnancy 4 Problems (from  11/09/16 to present)    Problem Noted Resolved   BMI 40.0-44.9, adult (HCC) 02/01/2017 by Conard NovakJackson, Donnavin Vandenbrink D, MD No   Maternal obesity, antepartum 01/04/2017 by Vena AustriaStaebler, Andreas, MD No   Overview Addendum 02/01/2017 11:16 AM by Conard NovakJackson, Raesean Bartoletti D, MD    BMI >=40 [x]  early 1h gtt - results pending (drawn 6/20) [x]  u/s for dating  [x]  nutritional goals [x]  folic acid 1mg  [x]  bASA (>12 weeks) [ ]  consider nutrition consult [ ]  consider maternal EKG 1st trimester [ ]  Growth u/s 28 [ ] , 32 [ ] , 36 weeks [ ]  [ ]  NST/AFI weekly 36+ weeks (36[] , 37[] , 38[] , 39[] , 40[] ) [ ]  IOL by 41 weeks (scheduled, prn [] )        History of cesarean section complicating pregnancy 01/04/2017 by Vena AustriaStaebler, Andreas, MD No   Overview Signed 01/04/2017  9:14 AM by Vena AustriaStaebler, Andreas, MD    G2 pregnancy with VBAC with G3      Supervision of high risk pregnancy, antepartum 12/14/2016 by Tresea MallGledhill, Jane, CNM No   Overview Addendum 03/08/2017  3:10 PM by Vena AustriaStaebler, Andreas, MD    Clinic [facility] Prenatal Labs  Dating 11 week US Blood type: A/Positive/-- (05/23 0936)   Genetic Screen Quad: [X]  negative  Antibody:Negative (05/23 0936)  Anatomic US Normal female 03/01/17 Rubella: <0.90 (05/23 [id number]) Varicella: I  GTT Early: [failed 1h, passed 3h 4/4]   Third trimester: [ ]  3h gtt RPR: Non Reactive (05/23 0936)   Rhogam n/a HBsAg: Negative (05/23 0936)   TDaP vaccine  [ ]           Flu Shot: HIV: Non Reactive (05/23 0936)   Baby Food  GBS:   Contraception  Pap: NIL 12/14/16  CBB   Baseline Labs:  UPC ratio 147,  TSH: 2.47 Hgba1c: 5.0 CMP normal  CS/VBAC Desires VBAC(1 successful)   Support Person                  Preterm labor symptoms and general obstetric precautions including but not limited to vaginal bleeding, contractions, leaking of fluid and fetal movement were reviewed in detail with the patient. Please refer to After Visit Summary for other counseling recommendations.    Return in about 4 weeks (around 04/26/2017) for schedule 3 hour gtt with 28 week labs and routine prenatal .  Thomasene Mohair, MD  03/29/2017 10:35 AM

## 2017-04-19 ENCOUNTER — Observation Stay
Admission: EM | Admit: 2017-04-19 | Discharge: 2017-04-19 | Disposition: A | Discharge status: Home / Self Care | Payer: Medicaid Other | Attending: Maternal Newborn | Admitting: Maternal Newborn

## 2017-04-19 ENCOUNTER — Encounter: Payer: Self-pay | Admitting: *Deleted

## 2017-04-19 DIAGNOSIS — O0990 Supervision of high risk pregnancy, unspecified, unspecified trimester: Secondary | ICD-10-CM

## 2017-04-19 DIAGNOSIS — O4702 False labor before 37 completed weeks of gestation, second trimester: Secondary | ICD-10-CM | POA: Diagnosis not present

## 2017-04-19 DIAGNOSIS — R109 Unspecified abdominal pain: Secondary | ICD-10-CM

## 2017-04-19 DIAGNOSIS — O99210 Obesity complicating pregnancy, unspecified trimester: Secondary | ICD-10-CM

## 2017-04-19 DIAGNOSIS — O34219 Maternal care for unspecified type scar from previous cesarean delivery: Secondary | ICD-10-CM

## 2017-04-19 DIAGNOSIS — Z6841 Body Mass Index (BMI) 40.0 and over, adult: Secondary | ICD-10-CM

## 2017-04-19 DIAGNOSIS — Z3A27 27 weeks gestation of pregnancy: Secondary | ICD-10-CM

## 2017-04-19 DIAGNOSIS — O26899 Other specified pregnancy related conditions, unspecified trimester: Secondary | ICD-10-CM

## 2017-04-19 LAB — URINALYSIS, ROUTINE W REFLEX MICROSCOPIC
BACTERIA UA: NONE SEEN
Bilirubin Urine: NEGATIVE
Glucose, UA: NEGATIVE mg/dL
Hgb urine dipstick: NEGATIVE
KETONES UR: NEGATIVE mg/dL
Nitrite: NEGATIVE
PROTEIN: NEGATIVE mg/dL
Specific Gravity, Urine: 1.026 (ref 1.005–1.030)
pH: 6 (ref 5.0–8.0)

## 2017-04-19 LAB — COMPREHENSIVE METABOLIC PANEL
ALBUMIN: 2.9 g/dL — AB (ref 3.5–5.0)
ALT: 11 U/L — AB (ref 14–54)
AST: 12 U/L — AB (ref 15–41)
Alkaline Phosphatase: 53 U/L (ref 38–126)
Anion gap: 6 (ref 5–15)
BUN: 6 mg/dL (ref 6–20)
CHLORIDE: 106 mmol/L (ref 101–111)
CO2: 24 mmol/L (ref 22–32)
Calcium: 8.7 mg/dL — ABNORMAL LOW (ref 8.9–10.3)
Creatinine, Ser: 0.45 mg/dL (ref 0.44–1.00)
GFR calc Af Amer: >60 mL/min (ref >60)
GLUCOSE: 87 mg/dL (ref 65–99)
POTASSIUM: 4.1 mmol/L (ref 3.5–5.1)
Sodium: 136 mmol/L (ref 135–145)
Total Bilirubin: 0.2 mg/dL — ABNORMAL LOW (ref 0.3–1.2)
Total Protein: 6.8 g/dL (ref 6.5–8.1)

## 2017-04-19 MED ORDER — FAMOTIDINE 20 MG PO TABS: 20.0000 mg | ORAL_TABLET | Freq: Every day | ORAL | Status: DC

## 2017-04-19 MED ORDER — FAMOTIDINE 20 MG PO TABS
ORAL_TABLET | ORAL | Status: AC
  Administered 2017-04-19: 20 mg
  Filled 2017-04-19: qty 1

## 2017-04-19 NOTE — OB Triage Note (Signed)
Recvd to OBS 3 with c/o abdominal pain that started at approximately 1600 today.  Describes pain as pressure and aching/cramping. Changed to gown and to bed.  EFM applied.  Oriented to room and plan of care discussed.  Verbalized understanding and agrees with plan.

## 2017-04-19 NOTE — Discharge Summary (Signed)
See final progress note from today's encounter in triage.  Marcelyn BruinsJacelyn Lashaunda Schild, CNM 04/19/2017  8:55 PM

## 2017-04-19 NOTE — Final Progress Note (Signed)
Physician Final Progress Note  Patient ID: Rachel Clayton N Gherardi MRN: [id number] DOB/AGE: [birth year] 25 y.o.  Admit date: 04/19/2017 Admitting provider: Vena AustriaAndreas Staebler, MD Discharge date: 04/19/2017   Admission Diagnoses: Abdominal pain during pregnancy,antepartum  Discharge Diagnoses:  Active Problems:   Abdominal pain during pregnancy, antepartum    History of Present Illness: The patient is a 26 y.o. female G4P3 at 1420w1d who presents for abdominal pain that began today around 5 pm. She has also experienced some nausea. She denies diarrhea, vomiting, dysuria, flank pain, and constipation. She has not eaten any food that may be spoiled. She is recovering from an upper respiratory virus.  10 point review of systems negative unless otherwise noted in HPI.  Past Medical History:  Diagnosis Date  . Asthma   . BMI 40.0-44.9, adult (HCC)   . History of oligohydramnios   . Recurrent UTI     Past Surgical History:  Procedure Laterality Date  . CESAREAN SECTION    . HAND SURGERY      No current facility-administered medications on file prior to encounter.    No current outpatient prescriptions on file prior to encounter.    No Known Allergies  Social History   Social History  . Marital status: Single    Spouse name: N/A  . Number of children: N/A  . Years of education: N/A   Occupational History  . Not on file.   Social History Main Topics  . Smoking status: Never Smoker  . Smokeless tobacco: Never Used  . Alcohol use No  . Drug use: No  . Sexual activity: Yes   Other Topics Concern  . Not on file   Social History Narrative  . No narrative on file    Physical Exam: BP 126/71 (BP Location: Left Arm)   Pulse (!) 108   Temp 99.2 F (37.3 C) (Oral)   Resp 18   Ht 5\' 4"  (1.626 m)   Wt 254 lb (115.2 kg)   LMP 11/09/2016   BMI 43.60 kg/m   Gen: NAD CV: RRR Pulm: Normal work of breathing, some rhonchi in lower lung fields Abdomen: Gravid, soft, non tender to  palpation, hypoactive bowel sounds Pelvic: deferred Ext: No signs of DVT  EFM:  Baseline: 135 Variability: moderate Accelerations: present Decelerations: absent Tocometry: occasional irritability The patient was monitored for 30+ minutes, fetal heart rate tracing was deemed reactive, category I tracing.  Consults: None  Lab results:  Results for orders placed or performed during the hospital encounter of 04/19/17 (from the past 24 hour(s))  Urinalysis, Routine w reflex microscopic     Status: Abnormal   Collection Time: 04/19/17  7:34 PM  Result Value Ref Range   Color, Urine YELLOW (A) YELLOW   APPearance HAZY (A) CLEAR   Specific Gravity, Urine 1.026 1.005 - 1.030   pH 6.0 5.0 - 8.0   Glucose, UA NEGATIVE NEGATIVE mg/dL   Hgb urine dipstick NEGATIVE NEGATIVE   Bilirubin Urine NEGATIVE NEGATIVE   Ketones, ur NEGATIVE NEGATIVE mg/dL   Protein, ur NEGATIVE NEGATIVE mg/dL   Nitrite NEGATIVE NEGATIVE   Leukocytes, UA SMALL (A) NEGATIVE   RBC / HPF 0-5 0 - 5 RBC/hpf   WBC, UA 0-5 0 - 5 WBC/hpf   Bacteria, UA NONE SEEN NONE SEEN   Squamous Epithelial / LPF 6-30 (A) NONE SEEN   Mucus PRESENT   Comprehensive metabolic panel     Status: Abnormal   Collection Time: 04/19/17  7:41 PM  Result Value  Ref Range   Sodium 136 135 - 145 mmol/L   Potassium 4.1 3.5 - 5.1 mmol/L   Chloride 106 101 - 111 mmol/L   CO2 24 22 - 32 mmol/L   Glucose, Bld 87 65 - 99 mg/dL   BUN 6 6 - 20 mg/dL   Creatinine, Ser 1.61 0.44 - 1.00 mg/dL   Calcium 8.7 (L) 8.9 - 10.3 mg/dL   Total Protein 6.8 6.5 - 8.1 g/dL   Albumin 2.9 (L) 3.5 - 5.0 g/dL   AST 12 (L) 15 - 41 U/L   ALT 11 (L) 14 - 54 U/L   Alkaline Phosphatase 53 38 - 126 U/L   Total Bilirubin 0.2 (L) 0.3 - 1.2 mg/dL   GFR calc non Af Amer >60 >60 mL/min   GFR calc Af Amer >60 >60 mL/min   Anion gap 6 5 - 15   Assessment: G4P3 presents with abdominal pain and nausea. Has improved while in triage after administration of an antacid.  May be  heartburn or a GI virus.  Plan: Discharge home with preterm labor precautions.  Discharge Condition: stable  Disposition: 01-Home or Self Care  Diet: Regular diet  Discharge Activity: Activity as tolerated  Discharge Instructions    Discharge activity:  No Restrictions    Complete by:  As directed    Discharge diet:  No restrictions    Complete by:  As directed    No sexual activity restrictions    Complete by:  As directed    Notify physician for a general feeling that "something is not right"    Complete by:  As directed    Notify physician for increase or change in vaginal discharge    Complete by:  As directed    Notify physician for intestinal cramps, with or without diarrhea, sometimes described as "gas pain"    Complete by:  As directed    Notify physician for leaking of fluid    Complete by:  As directed    Notify physician for low, dull backache, unrelieved by heat or Tylenol    Complete by:  As directed    Notify physician for menstrual like cramps    Complete by:  As directed    Notify physician for pelvic pressure    Complete by:  As directed    Notify physician for uterine contractions.  These may be painless and feel like the uterus is tightening or the baby is  "balling up"    Complete by:  As directed    Notify physician for vaginal bleeding    Complete by:  As directed    PRETERM LABOR:  Includes any of the follwing symptoms that occur between 20 - [redacted] weeks gestation.  If these symptoms are not stopped, preterm labor can result in preterm delivery, placing your baby at risk    Complete by:  As directed      Allergies as of 04/19/2017   No Known Allergies     Medication List    You have not been prescribed any medications.          Discharge Care Instructions        Start     Ordered   04/19/17 0000  PRETERM LABOR:  Includes any of the follwing symptoms that occur between 20 - [redacted] weeks gestation.  If these symptoms are not stopped, preterm labor  can result in preterm delivery, placing your baby at risk  (Preterm Labor Notify MD)  04/19/17 2032   04/19/17 0000  Notify physician for menstrual like cramps  (Preterm Labor Notify MD)     04/19/17 2032   04/19/17 0000  Notify physician for uterine contractions.  These may be painless and feel like the uterus is tightening or the baby is  "balling up"  (Preterm Labor Notify MD)     04/19/17 2032   04/19/17 0000  Notify physician for low, dull backache, unrelieved by heat or Tylenol  (Preterm Labor Notify MD)     04/19/17 2032   04/19/17 0000  Notify physician for intestinal cramps, with or without diarrhea, sometimes described as "gas pain"  (Preterm Labor Notify MD)     04/19/17 2032   04/19/17 0000  Notify physician for pelvic pressure  (Preterm Labor Notify MD)     04/19/17 2032   04/19/17 0000  Notify physician for increase or change in vaginal discharge  (Preterm Labor Notify MD)     04/19/17 2032   04/19/17 0000  Notify physician for vaginal bleeding  (Preterm Labor Notify MD)     04/19/17 2032   04/19/17 0000  Notify physician for a general feeling that "something is not right"  (Preterm Labor Notify MD)     04/19/17 2032   04/19/17 0000  Notify physician for leaking of fluid  (Preterm Labor Notify MD)     04/19/17 2032   04/19/17 0000  Discharge activity:  No Restrictions     04/19/17 2032   04/19/17 0000  No sexual activity restrictions     04/19/17 2032   04/19/17 0000  Discharge diet:  No restrictions     04/19/17 2032      Total time spent taking care of this patient: 15 minutes  Signed: Oswaldo Conroy, CNM  04/19/2017, 8:32 PM

## 2017-04-21 LAB — URINE CULTURE: Special Requests: NORMAL

## 2017-04-26 ENCOUNTER — Ambulatory Visit (INDEPENDENT_AMBULATORY_CARE_PROVIDER_SITE_OTHER): Payer: Medicaid Other | Admitting: Obstetrics and Gynecology

## 2017-04-26 ENCOUNTER — Encounter: Payer: Self-pay | Admitting: Obstetrics and Gynecology

## 2017-04-26 ENCOUNTER — Ambulatory Visit: Payer: Medicaid Other

## 2017-04-26 VITALS — BP 124/62 | Wt 258.0 lb

## 2017-04-26 DIAGNOSIS — O99210 Obesity complicating pregnancy, unspecified trimester: Secondary | ICD-10-CM

## 2017-04-26 DIAGNOSIS — O0990 Supervision of high risk pregnancy, unspecified, unspecified trimester: Secondary | ICD-10-CM

## 2017-04-26 DIAGNOSIS — Z6841 Body Mass Index (BMI) 40.0 and over, adult: Secondary | ICD-10-CM

## 2017-04-26 DIAGNOSIS — O34219 Maternal care for unspecified type scar from previous cesarean delivery: Secondary | ICD-10-CM

## 2017-04-26 NOTE — Progress Notes (Signed)
Routine Prenatal Care Visit  Subjective  Rachel Clayton is a 26 y.o. G4P3 at [redacted]w[redacted]d being seen today for ongoing prenatal care.  She is currently monitored for the following issues for this high-risk pregnancy and has Supervision of high risk pregnancy, antepartum; Maternal obesity, antepartum; History of cesarean section complicating pregnancy; BMI 40.0-44.9, adult (HCC); Abdominal pain during pregnancy, antepartum; and Abdominal pain affecting pregnancy on her problem list.  ----------------------------------------------------------------------------------- Patient reports no complaints.   Contractions: Not present. Vag. Bleeding: None.  Movement: Present. Denies leaking of fluid.  Growth u/s today 31st %ile, AFI 21 cm.  3 hour gtt today. ----------------------------------------------------------------------------------- The following portions of the patient's history were reviewed and updated as appropriate: allergies, current medications, past family history, past medical history, past social history, past surgical history and problem list. Problem list updated.  Objective  Blood pressure 124/62, weight 258 lb (117 kg), last menstrual period 11/09/2016. Pregravid weight 245 lb (111.1 kg) Total Weight Gain 13 lb (5.897 kg) Urinalysis:     Fetal Status: Fetal Heart Rate (bpm): 155   Movement: Present     General:  Alert, oriented and cooperative. Patient is in no acute distress.  Skin: Skin is warm and dry. No rash noted.   Cardiovascular: Normal heart rate noted  Respiratory: Normal respiratory effort, no problems with respiration noted  Abdomen: Soft, gravid, appropriate for gestational age. Pain/Pressure: Absent     Pelvic:  Cervical exam deferred        Extremities: Normal range of motion.     Mental Status: Normal mood and affect. Normal behavior. Normal judgment and thought content.   Assessment   26 y.o. G4P3 at [redacted]w[redacted]d by  07/18/2017, by Ultrasound presenting for routine prenatal  visit  Plan   pregnancy 4 Problems (from 11/09/16 to present)    Problem Noted Resolved   BMI 40.0-44.9, adult (HCC) 02/01/2017 by Conard Novak, MD No   Maternal obesity, antepartum 01/04/2017 by Vena Austria, MD No   Overview Addendum 02/01/2017 11:16 AM by Conard Novak, MD    BMI >=40  early 1h gtt - failed - 3h gtt normal, 3 h gtt 28 weeks  u/s for dating   nutritional goals  folic acid   bASA (>12 weeks)  consider nutrition consult  Growth u/s 28 , 32 , 36 weeks   NST/AFI weekly 36+ weeks (36[] , 37[] , 38[] , 39[] , 40[] )  IOL by 41 weeks (scheduled, prn )        History of cesarean section complicating pregnancy 01/04/2017 by Vena Austria, MD No   Overview Signed 01/04/2017  9:14 AM by Vena Austria, MD    G2 pregnancy with VBAC with G3      Supervision of high risk pregnancy, antepartum 12/14/2016 by Tresea Mall, CNM No   Overview Addendum 03/08/2017  3:10 PM by Vena Austria, MD    [facility]inic [facility] Prenatal Labs  Dating 11 week [place] Blood type: A/Positive/-- (05/23 0936)   Genetic Screen Quad:  negative  Antibody:Negative (05/23 0936)  Anatomic [place] Normal female 03/01/17 Rubella: <0.90 (05/23 9147) Varicella: I  GTT Early: [failed-3h nml]   Third trimester: pending  RPR: Non Reactive (05/23 0936)   Rhogam n/a HBsAg: Negative (05/23 0936)   TDaP vaccine            Flu Shot: HIV: Non Reactive (05/23 0936)   Baby Food  GBS:   Contraception  Pap: NIL 12/14/16  CBB   Baseline Labs:  UPC ratio 147,  TSH: 2.47 Hgba1c: 5.0 CMP normal  CS/VBAC Desires VBAC(1 successful)   Support Person                   Preterm labor symptoms and general obstetric precautions including but not limited to vaginal bleeding, contractions, leaking of fluid and fetal movement were reviewed in detail with the patient. Please refer to After Visit Summary for other counseling recommendations.    Return in about 2 weeks (around 05/10/2017) for Routine Prenatal Appointment.  Thomasene MohairStephen Heraclio Seidman, MD  04/26/2017 9:46 AM

## 2017-04-26 NOTE — Progress Notes (Signed)
3hour GTT today/28 week labs

## 2017-04-26 NOTE — Addendum Note (Signed)
Addended by: Kathlene CoteSTANLEY, Porcia Morganti G on: 04/26/2017 08:54 AM   Modules accepted: Orders

## 2017-04-27 LAB — GESTATIONAL GLUCOSE TOLERANCE
GLUCOSE 2 HOUR GTT: 119 mg/dL (ref 65–154)
GLUCOSE 3 HOUR GTT: 96 mg/dL (ref 65–139)
GLUCOSE FASTING: 77 mg/dL (ref 65–94)
Glucose, GTT - 1 Hour: 158 mg/dL (ref 65–179)

## 2017-04-27 LAB — CBC WITH DIFFERENTIAL/PLATELET
BASOS: 0 %
Basophils Absolute: 0 10*3/uL (ref 0.0–0.2)
EOS (ABSOLUTE): 0.1 10*3/uL (ref 0.0–0.4)
Eos: 1 %
Hematocrit: 31.6 % — ABNORMAL LOW (ref 34.0–46.6)
Hemoglobin: 10.5 g/dL — ABNORMAL LOW (ref 11.1–15.9)
IMMATURE GRANS (ABS): 0 10*3/uL (ref 0.0–0.1)
IMMATURE GRANULOCYTES: 0 %
LYMPHS: 17 %
Lymphocytes Absolute: 1.6 10*3/uL (ref 0.7–3.1)
MCH: 27.2 pg (ref 26.6–33.0)
MCHC: 33.2 g/dL (ref 31.5–35.7)
MCV: 82 fL (ref 79–97)
MONOCYTES: 4 %
Monocytes Absolute: 0.4 10*3/uL (ref 0.1–0.9)
NEUTROS PCT: 78 %
Neutrophils Absolute: 7.3 10*3/uL — ABNORMAL HIGH (ref 1.4–7.0)
PLATELETS: 212 10*3/uL (ref 150–379)
RBC: 3.86 x10E6/uL (ref 3.77–5.28)
RDW: 13.1 % (ref 12.3–15.4)
WBC: 9.5 10*3/uL (ref 3.4–10.8)

## 2017-04-27 LAB — HIV ANTIBODY (ROUTINE TESTING W REFLEX): HIV SCREEN 4TH GENERATION: NONREACTIVE

## 2017-04-27 LAB — RPR QUALITATIVE: RPR Ser Ql: NONREACTIVE

## 2017-05-10 ENCOUNTER — Ambulatory Visit (INDEPENDENT_AMBULATORY_CARE_PROVIDER_SITE_OTHER): Payer: Medicaid Other | Admitting: Advanced Practice Midwife

## 2017-05-10 VITALS — BP 120/80 | Wt 256.0 lb

## 2017-05-10 DIAGNOSIS — Z3A30 30 weeks gestation of pregnancy: Secondary | ICD-10-CM

## 2017-05-10 MED ORDER — TETANUS-DIPHTH-ACELL PERTUSSIS 5-2.5-18.5 LF-MCG/0.5 IM SUSP
0.5000 mL | Freq: Once | INTRAMUSCULAR | Status: AC
  Administered 2017-05-10: 0.5 mL via INTRAMUSCULAR

## 2017-05-10 NOTE — Progress Notes (Signed)
Good fetal movement. Denies Ctx's, LOF, VB. TDAP given today. No questions or concerns. ROB in 2 weeks.

## 2017-05-10 NOTE — Addendum Note (Signed)
Addended by: Cornelius Moras D on: 05/10/2017 10:50 AM   Modules accepted: Orders

## 2017-05-24 ENCOUNTER — Ambulatory Visit (INDEPENDENT_AMBULATORY_CARE_PROVIDER_SITE_OTHER): Payer: Medicaid Other | Admitting: Advanced Practice Midwife

## 2017-05-24 VITALS — BP 128/84 | Wt 254.0 lb

## 2017-05-24 DIAGNOSIS — Z3A32 32 weeks gestation of pregnancy: Secondary | ICD-10-CM

## 2017-05-24 DIAGNOSIS — Z6841 Body Mass Index (BMI) 40.0 and over, adult: Secondary | ICD-10-CM

## 2017-05-24 DIAGNOSIS — O0990 Supervision of high risk pregnancy, unspecified, unspecified trimester: Secondary | ICD-10-CM

## 2017-05-24 NOTE — Progress Notes (Signed)
Routine Prenatal Care Visit  Subjective  Rachel Clayton is a 26 y.o. G4P3 at [redacted]w[redacted]d being seen today for ongoing prenatal care.  She is currently monitored for the following issues for this high-risk pregnancy and has Supervision of high risk pregnancy, antepartum; Maternal obesity, antepartum; History of cesarean section complicating pregnancy; BMI 40.0-44.9, adult (HCC); Abdominal pain during pregnancy, antepartum; and Abdominal pain affecting pregnancy on her problem list.  ----------------------------------------------------------------------------------- Patient reports no complaints.   Contractions: Not present. Vag. Bleeding: None.  Movement: Present. Denies leaking of fluid.  ----------------------------------------------------------------------------------- The following portions of the patient's history were reviewed and updated as appropriate: allergies, current medications, past family history, past medical history, past social history, past surgical history and problem list. Problem list updated.   Objective  Blood pressure 128/84, weight 254 lb (115.2 kg), last menstrual period 11/09/2016. Pregravid weight 245 lb (111.1 kg) Total Weight Gain 9 lb (4.082 kg) Urinalysis: Urine Protein: Negative Urine Glucose: Negative  Fetal Status:     Movement: Present     General:  Alert, oriented and cooperative. Patient is in no acute distress.  Skin: Skin is warm and dry. No rash noted.   Cardiovascular: Normal heart rate noted  Respiratory: Normal respiratory effort, no problems with respiration noted  Abdomen: Soft, gravid, appropriate for gestational age. Pain/Pressure: Present     Pelvic:  Cervical exam deferred        Extremities: Normal range of motion.     Mental Status: Normal mood and affect. Normal behavior. Normal judgment and thought content.   Assessment   26 y.o. G4P3 at [redacted]w[redacted]d by  07/18/2017, by Ultrasound presenting for routine prenatal visit  Plan   pregnancy 4  Problems (from 11/09/16 to present)    Problem Noted Resolved   BMI 40.0-44.9, adult (HCC) 02/01/2017 by Conard Novak, MD No   Maternal obesity, antepartum 01/04/2017 by Vena Austria, MD No   Overview Addendum 02/01/2017 11:16 AM by Conard Novak, MD    BMI >=40  early 1h gtt - results pending (drawn 6/20)  u/s for dating   nutritional goals  folic acid   bASA (>12 weeks)- reminded to take daily  consider nutrition consult  consider maternal EKG 1st trimester  Growth u/s 28 , 32 [not scheduled], 34 weeks [scheduled]  NST/AFI weekly 36+ weeks (36[] , 37[] , 38[] , 39[] , 40[] )  IOL by 41 weeks (scheduled, prn )        History of cesarean section complicating pregnancy 01/04/2017 by Vena Austria, MD No   Overview Signed 01/04/2017  9:14 AM by Vena Austria, MD    G2 pregnancy with VBAC with G3      Supervision of high risk pregnancy, antepartum 12/14/2016 by Tresea Mall, CNM No   Overview Addendum 03/08/2017  3:10 PM by Vena Austria, MD    Clinic [facility] Prenatal Labs  Dating 11 week [place] Blood type: A/Positive/-- (05/23 0936)   Genetic Screen Quad:  negative  Antibody:Negative (05/23 0936)  Anatomic [place] Normal female 03/01/17 Rubella: <0.90 (05/23 3244) Varicella: I  GTT Early:   Third trimester:  RPR: Non Reactive (05/23 0936)   Rhogam n/a HBsAg: Negative (05/23 0936)   TDaP vaccine            Flu Shot: HIV: Non Reactive (05/23 0936)   Baby Food  GBS:   Contraception  Pap: NIL 12/14/16  CBB   Baseline Labs:  UPC ratio 147,  TSH: 2.47 Hgba1c: 5.0 CMP normal  CS/VBAC Desires VBAC(1 successful)   Support Person                   Preterm labor symptoms and general obstetric precautions including but not limited to vaginal bleeding, contractions, leaking of fluid and fetal movement were reviewed in detail with the patient.    Return in about 2 weeks (around 06/07/2017) for  rob.  Tresea Mall, CNM  05/24/2017 9:56 AM

## 2017-05-24 NOTE — Progress Notes (Signed)
No vb. No lof.  

## 2017-05-28 ENCOUNTER — Observation Stay
Admission: EM | Admit: 2017-05-28 | Discharge: 2017-05-28 | Disposition: A | Discharge status: Home / Self Care | Payer: Medicaid Other | Attending: Obstetrics and Gynecology | Admitting: Obstetrics and Gynecology

## 2017-05-28 DIAGNOSIS — Z87440 Personal history of urinary (tract) infections: Secondary | ICD-10-CM | POA: Diagnosis not present

## 2017-05-28 DIAGNOSIS — Z3A32 32 weeks gestation of pregnancy: Secondary | ICD-10-CM | POA: Diagnosis not present

## 2017-05-28 DIAGNOSIS — O99210 Obesity complicating pregnancy, unspecified trimester: Secondary | ICD-10-CM

## 2017-05-28 DIAGNOSIS — J45909 Unspecified asthma, uncomplicated: Secondary | ICD-10-CM | POA: Insufficient documentation

## 2017-05-28 DIAGNOSIS — O4703 False labor before 37 completed weeks of gestation, third trimester: Principal | ICD-10-CM | POA: Insufficient documentation

## 2017-05-28 DIAGNOSIS — Z6841 Body Mass Index (BMI) 40.0 and over, adult: Secondary | ICD-10-CM

## 2017-05-28 DIAGNOSIS — O4103X0 Oligohydramnios, third trimester, not applicable or unspecified: Secondary | ICD-10-CM | POA: Insufficient documentation

## 2017-05-28 DIAGNOSIS — O0990 Supervision of high risk pregnancy, unspecified, unspecified trimester: Secondary | ICD-10-CM

## 2017-05-28 DIAGNOSIS — O34219 Maternal care for unspecified type scar from previous cesarean delivery: Secondary | ICD-10-CM

## 2017-05-28 LAB — URINALYSIS, COMPLETE (UACMP) WITH MICROSCOPIC
Bilirubin Urine: NEGATIVE
Glucose, UA: NEGATIVE mg/dL
HGB URINE DIPSTICK: NEGATIVE
Ketones, ur: NEGATIVE mg/dL
LEUKOCYTES UA: NEGATIVE
NITRITE: POSITIVE — AB
Protein, ur: NEGATIVE mg/dL
RBC / HPF: NONE SEEN RBC/hpf (ref 0–5)
SPECIFIC GRAVITY, URINE: 1.015 (ref 1.005–1.030)
WBC, UA: NONE SEEN WBC/hpf (ref 0–5)
pH: 7 (ref 5.0–8.0)

## 2017-05-28 LAB — WET PREP, GENITAL
Clue Cells Wet Prep HPF POC: NONE SEEN
SPERM: NONE SEEN
TRICH WET PREP: NONE SEEN
YEAST WET PREP: NONE SEEN

## 2017-05-28 LAB — CHLAMYDIA/NGC RT PCR (ARMC ONLY)
CHLAMYDIA TR: NOT DETECTED
N gonorrhoeae: NOT DETECTED

## 2017-05-28 LAB — FETAL FIBRONECTIN: Fetal Fibronectin: NEGATIVE

## 2017-05-28 LAB — OB RESULTS CONSOLE GC/CHLAMYDIA
CHLAMYDIA, DNA PROBE: NEGATIVE
Gonorrhea: NEGATIVE

## 2017-05-28 NOTE — Final Progress Note (Addendum)
Physician Final Progress Note  Patient ID: Rachel Clayton MRN: [id number] DOB/AGE: 1990-09-25 26 y.o.  Admit date: 05/28/2017 Admitting provider: Conard Novak, MD Discharge date: 05/28/2017  Admission Diagnoses:  1) intrauterine pregnancy at [redacted]w[redacted]d 2) abdominal pain affecting pregnancy, third trimester  Discharge Diagnoses:  1) intrauterine pregnancy at [redacted]w[redacted]d 2) abdominal pain affecting pregnancy, third trimester, no evidence of labor (false labor)  History of Present Illness: The patient is a 26 y.o. female G4P3 at [redacted]w[redacted]d who presents for regular uterine contractions since last night. She fell asleep and was awoken this morning by the similar contracts. The contractions were five minutes apart.  They were painful, rated 5-6/10, located in her lower, mid abdomen.  She denies vaginal bleeding and leakage of fluid.  She notes +FM.  She denies urinary symptoms, vaginal symptoms. She did have nausea this morning with one episode of emesis of water.  She denies nausea at this time.  Denies fevers and chills and any other symptoms.    Hospital Course:  Admitted for above.  She had a pelvic exam and her cervix was noted to be 1 cm.  She had an equivocal urinalysis, but overall not suggestive of infection, negative wet prep and gonorrhea/chlamydai NAAT.  She had a negative fetal fibronectin.  She fetal tracing was reassuring, but not reactive.  A BPP was performed and was 8/10 (2 off for the NST, otherwise quickly normal). At the time of discharge she had no symptoms and had none for a couple of hours.  She was discharged with preterm labor precautions and it was recommended that she follow up with her routine appointment on 06/07/17.  Past Medical History:  Diagnosis Date  . Asthma   . BMI 40.0-44.9, adult (HCC)   . History of oligohydramnios   . Medical history non-contributory   . Recurrent UTI     Past Surgical History:  Procedure Laterality Date  . CESAREAN SECTION    . HAND SURGERY       No current facility-administered medications on file prior to encounter.    No current outpatient prescriptions on file prior to encounter.   Allergies: No Known Allergies  Social History   Social History  . Marital status: Single    Spouse name: N/A  . Number of children: N/A  . Years of education: N/A   Occupational History  . Not on file.   Social History Main Topics  . Smoking status: Never Smoker  . Smokeless tobacco: Never Used  . Alcohol use No  . Drug use: No  . Sexual activity: Yes    Birth control/ protection: Surgical     Comment: BTL-No Consent    Other Topics Concern  . Not on file   Social History Narrative  . No narrative on file   Family History  Problem Relation Age of Onset  . Hypertension Mother     Physical Exam: BP (!) 100/59   Pulse 90   Temp 98.5 F (36.9 C) (Oral)   Resp 18   Ht  (1.626 m)   Wt 258 lb (117 kg)   LMP 11/09/2016   BMI 44.29 kg/m   Physical Exam  Constitutional: She is oriented to person, place, and time. She appears well-developed and well-nourished. No distress.  HENT:  Head: Normocephalic and atraumatic.  Eyes: Conjunctivae are normal. No scleral icterus.  Neck: Normal range of motion. Neck supple. No thyromegaly present.  Cardiovascular: Normal rate and regular rhythm.  Exam reveals no gallop  and no friction rub.   No murmur heard. Pulmonary/Chest: Effort normal and breath sounds normal. No respiratory distress. She has no wheezes. She has no rales.  Abdominal: Soft. Bowel sounds are normal. She exhibits no distension. There is no tenderness. There is no rebound and no guarding.  Gravid, NT  Genitourinary:  Genitourinary Comments: Per RN Cervix: 1/thich/high x 2 > 2 hours apart  Musculoskeletal: Normal range of motion. She exhibits no edema or tenderness.  Lymphadenopathy:    She has no cervical adenopathy.  Neurological: She is alert and oriented to person, place, and time. No cranial nerve deficit.   Psychiatric: She has a normal mood and affect. Her behavior is normal. Judgment normal.     Consults: None  Significant Findings/ Diagnostic Studies:  Lab Results  Component Value Date   APPEARANCEUR HAZY (A) 05/28/2017   GLUCOSEU NEGATIVE 05/28/2017   BILIRUBINUR NEGATIVE 05/28/2017   KETONESUR NEGATIVE 05/28/2017   LABSPEC 1.015 05/28/2017   HGBUR NEGATIVE 05/28/2017   PHURINE 7.0 05/28/2017   NITRITE POSITIVE (A) 05/28/2017   LEUKOCYTESUR NEGATIVE 05/28/2017   RBCU NONE SEEN 05/28/2017   WBCU NONE SEEN 05/28/2017   BACTERIA RARE (A) 05/28/2017   EPIU 0-5 (A) 05/28/2017   MUCOUSUACOMP PRESENT 06/26/2014    Lab Results  Component Value Date   TRICHWETPREP NONE SEEN 05/28/2017   CLUECELLS NONE SEEN 05/28/2017   WBCWETPREP MODERATE (A) 05/28/2017   YEASTWETPREP NONE SEEN 05/28/2017    Lab Results  Component Value Date   CHLAMYDIA NOT DETECTED 05/28/2017   NGONORRHOEAE NOT DETECTED 05/28/2017   Recent Labs     05/28/17  1158  FFN  NEGATIVE    Procedures:  NST Baseline FHR: 140 beats/min Variability: moderate Accelerations: absent Decelerations: absent Tocometry: no activity  Interpretation:  INDICATIONS: rule out uterine contractions RESULTS:  A NST procedure was performed with FHR monitoring and a normal baseline established, appropriate time of 20-40 minutes of evaluation, and accels >2 seen w 15x15 characteristics.  Results show a REACTIVE NST.    Biophysical Profile:  (8/10) - performed by me at bedside Fetal Breathing movements: 2 points Gross Body Movements: 2 points Fetal Tone: 2 points Amniotic fluid volume: 2 points NST: 0 points Of note: fetus in complete breech  Discharge Condition: stable  Disposition: 01-Home or Self Care  Diet: Regular diet  Discharge Activity: Activity as tolerated   Allergies as of 05/28/2017   No Known Allergies     Medication List    You have not been prescribed any medications.     Total time spent  taking care of this patient: 45 minutes  Signed: Thomasene Mohair, MD  05/28/2017, 3:25 PM

## 2017-05-28 NOTE — OB Triage Note (Signed)
Pt is a G4P3 that presents from ED c/o Ctx every 10-15 minutes that started around 2300 on 05/27/17. Pt rates pain at 6/10 and denies LOF or VB.

## 2017-05-28 NOTE — Discharge Summary (Signed)
See final progress note. 

## 2017-05-29 LAB — URINE CULTURE: Special Requests: NORMAL

## 2017-05-31 LAB — CULTURE, BETA STREP (GROUP B ONLY)

## 2017-06-07 ENCOUNTER — Ambulatory Visit (INDEPENDENT_AMBULATORY_CARE_PROVIDER_SITE_OTHER): Payer: Medicaid Other | Admitting: Maternal Newborn

## 2017-06-07 ENCOUNTER — Ambulatory Visit (INDEPENDENT_AMBULATORY_CARE_PROVIDER_SITE_OTHER): Payer: Medicaid Other

## 2017-06-07 VITALS — BP 120/68 | Wt 258.0 lb

## 2017-06-07 DIAGNOSIS — Z362 Encounter for other antenatal screening follow-up: Secondary | ICD-10-CM | POA: Diagnosis not present

## 2017-06-07 DIAGNOSIS — O0990 Supervision of high risk pregnancy, unspecified, unspecified trimester: Secondary | ICD-10-CM

## 2017-06-07 DIAGNOSIS — Z6841 Body Mass Index (BMI) 40.0 and over, adult: Secondary | ICD-10-CM

## 2017-06-07 DIAGNOSIS — Z3A34 34 weeks gestation of pregnancy: Secondary | ICD-10-CM

## 2017-06-07 NOTE — Progress Notes (Signed)
Routine Prenatal Care Visit  Subjective  Rachel Clayton is a 26 y.o. G4P3 at 6882w1d being seen today for ongoing prenatal care.  She is currently monitored for the following issues for this high-risk pregnancy and has Supervision of high risk pregnancy, antepartum; Maternal obesity, antepartum; History of cesarean section complicating pregnancy; BMI 40.0-44.9, adult (HCC); Abdominal pain during pregnancy, antepartum; Abdominal pain affecting pregnancy; and Labor and delivery, indication for care on her problem list.  ----------------------------------------------------------------------------------- Patient reports pelvic pressure.   Contractions: Not present. Vag. Bleeding: None.  Movement: Present. Denies leaking of fluid.  ----------------------------------------------------------------------------------- The following portions of the patient's history were reviewed and updated as appropriate: allergies, current medications, past family history, past medical history, past social history, past surgical history and problem list. Problem list updated.   Objective  Blood pressure 120/68, weight 258 lb (117 kg), last menstrual period 11/09/2016. Pregravid weight 245 lb (111.1 kg) Total Weight Gain 13 lb (5.897 kg) Urinalysis:      Fetal Status: Fetal Heart Rate (bpm): 146 Fundal Height: 36 cm Movement: Present     General:  Alert, oriented and cooperative. Patient is in no acute distress.  Skin: Skin is warm and dry. No rash noted.   Cardiovascular: Normal heart rate noted  Respiratory: Normal respiratory effort, no problems with respiration noted  Abdomen: Soft, gravid, appropriate for gestational age. Pain/Pressure: Present     Pelvic:  Cervical exam deferred        Extremities: Normal range of motion.     Mental Status: Normal mood and affect. Normal behavior. Normal judgment and thought content.     Assessment   26 y.o. G4P3 at 5182w1d by  07/18/2017, by Ultrasound presenting for  routine prenatal visit.  Plan   pregnancy 4 Problems (from 11/09/16 to present)    Problem Noted Resolved   BMI 40.0-44.9, adult (HCC) 02/01/2017 by Conard NovakJackson, Stephen D, MD No   Maternal obesity, antepartum 01/04/2017 by Vena AustriaStaebler, Andreas, MD No   Overview Addendum 02/01/2017 11:16 AM by Conard NovakJackson, Stephen D, MD    BMI >=40 [x]  early 1h gtt - results pending (drawn 6/20) [x]  u/s for dating  [x]  nutritional goals [x]  folic acid 1mg  [x]  bASA (>12 weeks) [ ]  consider nutrition consult [ ]  consider maternal EKG 1st trimester [ ]  Growth u/s 28 [ ] , 32 [ ] , 36 weeks [ ]  [ ]  NST/AFI weekly 36+ weeks (36[] , 37[] , 38[] , 39[] , 40[] ) [ ]  IOL by 41 weeks (scheduled, prn [] )        History of cesarean section complicating pregnancy 01/04/2017 by Vena AustriaStaebler, Andreas, MD No   Overview Signed 01/04/2017  9:14 AM by Vena AustriaStaebler, Andreas, MD    G2 pregnancy with VBAC with G3      Supervision of high risk pregnancy, antepartum 12/14/2016 by Tresea MallGledhill, Jane, CNM No   Overview Addendum 03/08/2017  3:10 PM by Vena AustriaStaebler, Andreas, MD    [facility]inic [facility] Prenatal Labs  Dating 11 week US Blood type: A/Positive/-- (05/23 0936)   Genetic Screen Quad: [X]  negative  Antibody:Negative (05/23 0936)  Anatomic US Normal female 03/01/17 Rubella: <0.90 (05/23 16100936) Varicella: I  GTT Early: [ ]   Third trimester:  RPR: Non Reactive (05/23 0936)   Rhogam n/a HBsAg: Negative (05/23 0936)   TDaP vaccine  [ ]           Flu Shot: HIV: Non Reactive (05/23 0936)   Baby Food  GBS:   Contraception  Pap: NIL 12/14/16  CBB   Baseline Labs:  UPC ratio 147,  TSH: 2.47 Hgba1c: 5.0 CMP normal  CS/VBAC Desires VBAC(1 successful)   Support Person                Growth ultrasound today at 48th percentile, head circumference 96.4%. AFI 16.7. EFW: 5 lb 5 oz.  Preterm labor symptoms and general obstetric precautions including but not limited to vaginal bleeding, contractions, leaking of fluid and fetal movement  were reviewed in detail with the patient.  Return in about 2 weeks (around 06/21/2017) for ROB.  Marcelyn Bruins, CNM 06/07/2017  12:40 PM

## 2017-06-21 ENCOUNTER — Ambulatory Visit (INDEPENDENT_AMBULATORY_CARE_PROVIDER_SITE_OTHER): Payer: Medicaid Other | Admitting: Obstetrics and Gynecology

## 2017-06-21 VITALS — BP 128/84 | Wt 261.0 lb

## 2017-06-21 DIAGNOSIS — Z3685 Encounter for antenatal screening for Streptococcus B: Secondary | ICD-10-CM

## 2017-06-21 DIAGNOSIS — O99210 Obesity complicating pregnancy, unspecified trimester: Secondary | ICD-10-CM

## 2017-06-21 DIAGNOSIS — Z3A36 36 weeks gestation of pregnancy: Secondary | ICD-10-CM

## 2017-06-21 DIAGNOSIS — Z113 Encounter for screening for infections with a predominantly sexual mode of transmission: Secondary | ICD-10-CM

## 2017-06-21 DIAGNOSIS — O34219 Maternal care for unspecified type scar from previous cesarean delivery: Secondary | ICD-10-CM

## 2017-06-21 DIAGNOSIS — O0990 Supervision of high risk pregnancy, unspecified, unspecified trimester: Secondary | ICD-10-CM

## 2017-06-21 NOTE — Progress Notes (Signed)
ROB Some vaginal pressure and having braxton hicks  GBS/ Aptima  Declined Flu

## 2017-06-21 NOTE — Progress Notes (Signed)
Routine Prenatal Care Visit  Subjective  Rachel Clayton is a 26 y.o. G4P3 at 659w1d being seen today for ongoing prenatal care.  She is currently monitored for the following issues for this high-risk pregnancy and has Supervision of high risk pregnancy, antepartum; Maternal obesity, antepartum; History of cesarean section complicating pregnancy; BMI 40.0-44.9, adult (HCC); and Abdominal pain during pregnancy, antepartum on their problem list.  ----------------------------------------------------------------------------------- Patient reports no complaints.   Contractions: Irregular. Vag. Bleeding: None.  Movement: Present. Denies leaking of fluid.  ----------------------------------------------------------------------------------- The following portions of the patient's history were reviewed and updated as appropriate: allergies, current medications, past family history, past medical history, past social history, past surgical history and problem list. Problem list updated.   Objective  Blood pressure 128/84, weight 261 lb (118.4 kg), last menstrual period 11/09/2016. Pregravid weight 245 lb (111.1 kg) Total Weight Gain 16 lb (7.258 kg) Urinalysis: Urine Protein: Trace Urine Glucose: Trace Body mass index is 44.8 kg/m.  Fetal Status: Fetal Heart Rate (bpm): 155 Fundal Height: 38 cm Movement: Present     General:  Alert, oriented and cooperative. Patient is in no acute distress.  Skin: Skin is warm and dry. No rash noted.   Cardiovascular: Normal heart rate noted  Respiratory: Normal respiratory effort, no problems with respiration noted  Abdomen: Soft, gravid, appropriate for gestational age. Pain/Pressure: Present     Pelvic:  Cervical exam performed Dilation: 2.5 Effacement (%): 50 Station: -3  Extremities: Normal range of motion.     ental Status: Normal mood and affect. Normal behavior. Normal judgment and thought content.   Immunization History  Administered Date(s)  Administered  . Tdap 05/10/2017    Assessment   26 y.o. G4P3 at 289w1d by  07/18/2017, by Ultrasound presenting for routine prenatal visit  Plan   pregnancy 4 Problems (from 11/09/16 to present)    Problem Noted Resolved   BMI 40.0-44.9, adult (HCC) 02/01/2017 by Conard NovakJackson, Stephen D, MD No   Maternal obesity, antepartum 01/04/2017 by Vena AustriaStaebler, Seerat Peaden, MD No   Overview Addendum 02/01/2017 11:16 AM by Conard NovakJackson, Stephen D, MD    BMI >=40 [x]  early 1h gtt - results pending (drawn 6/20) [x]  u/s for dating  [x]  nutritional goals [x]  folic acid 1mg  [x]  bASA (>12 weeks) [ ]  consider nutrition consult [ ]  consider maternal EKG 1st trimester [ ]  Growth u/s 28 [ ] , 32 [ ] , 36 weeks [ ]  [ ]  NST/AFI weekly 36+ weeks (36[] , 37[] , 38[] , 39[] , 40[] ) [ ]  IOL by 41 weeks (scheduled, prn [] )        History of cesarean section complicating pregnancy 01/04/2017 by Vena AustriaStaebler, Zaiah Eckerson, MD No   Overview Signed 01/04/2017  9:14 AM by Vena AustriaStaebler, Melissaann Dizdarevic, MD    G2 pregnancy with VBAC with G3      Supervision of high risk pregnancy, antepartum 12/14/2016 by Tresea MallGledhill, Jane, CNM No   Overview Addendum 03/08/2017  3:10 PM by Vena AustriaStaebler, Jakeim Sedore, MD    Clinic [facility] Prenatal Labs  Dating 11 week US Blood type: A/Positive/-- (05/23 0936)   Genetic Screen Quad: [X]  negative  Antibody:Negative (05/23 0936)  Anatomic US Normal female 03/01/17 Rubella: <0.90 (05/23 21300936) Varicella: I  GTT Early: 141 with 3-hr 82 / 150 / 127 / 110 Third trimester: 77 / 158 / 119 / 96 RPR: Non Reactive (05/23 0936)   Rhogam n/a HBsAg: Negative (05/23 0936)   TDaP vaccine  [ ]           Flu Shot: HIV:  Non Reactive (05/23 0936)   Baby Food                                GBS:   Contraception  Pap: NIL 12/14/16  CBB   Baseline Labs:  UPC ratio 147,  TSH: 2.47 Hgba1c: 5.0 CMP normal  CS/VBAC Desires VBAC(1 successful)   Support Person                   Term labor symptoms and general obstetric precautions including but not limited to  vaginal bleeding, contractions, leaking of fluid and fetal movement were reviewed in detail with the patient. Please refer to After Visit Summary for other counseling recommendations.  - start NST/AFI was not ordered for today - GBS and aptima today Return in about 2 days (around 06/23/2017) for 2-5 days ROB/NST/AFI.

## 2017-06-23 ENCOUNTER — Ambulatory Visit (INDEPENDENT_AMBULATORY_CARE_PROVIDER_SITE_OTHER): Payer: Medicaid Other | Admitting: Certified Nurse Midwife

## 2017-06-23 ENCOUNTER — Ambulatory Visit (INDEPENDENT_AMBULATORY_CARE_PROVIDER_SITE_OTHER): Payer: Medicaid Other

## 2017-06-23 ENCOUNTER — Encounter: Payer: Self-pay | Admitting: Certified Nurse Midwife

## 2017-06-23 VITALS — BP 136/82 | Wt 262.0 lb

## 2017-06-23 DIAGNOSIS — O0990 Supervision of high risk pregnancy, unspecified, unspecified trimester: Secondary | ICD-10-CM

## 2017-06-23 DIAGNOSIS — Z3689 Encounter for other specified antenatal screening: Secondary | ICD-10-CM

## 2017-06-23 DIAGNOSIS — O99210 Obesity complicating pregnancy, unspecified trimester: Secondary | ICD-10-CM | POA: Diagnosis not present

## 2017-06-23 DIAGNOSIS — Z3685 Encounter for antenatal screening for Streptococcus B: Secondary | ICD-10-CM | POA: Diagnosis not present

## 2017-06-23 DIAGNOSIS — Z6841 Body Mass Index (BMI) 40.0 and over, adult: Secondary | ICD-10-CM

## 2017-06-23 DIAGNOSIS — Z3A36 36 weeks gestation of pregnancy: Secondary | ICD-10-CM | POA: Diagnosis not present

## 2017-06-23 LAB — STREP GP B NAA: Strep Gp B NAA: NEGATIVE

## 2017-06-23 LAB — GC/CHLAMYDIA PROBE AMP
Chlamydia trachomatis, NAA: NEGATIVE
Neisseria gonorrhoeae by PCR: NEGATIVE

## 2017-06-23 NOTE — Progress Notes (Signed)
ROB  NST/ AFI ultrasound  Some braxton hicks at night

## 2017-06-23 NOTE — Progress Notes (Signed)
HROB at [facility]36wk2d here for NST/ AFI for obesity with BMI>40. Prior CS with successful VBAC NST reactive with baseline 130 and accelerations to 150, moderate variability AFI=9.75cm/ vertex Wants to bottle feed Desires BTL 30 day papers signed today but explained that she will probably need interval tubal since she will probably deliver before 30 days NST/AFI 1 week Rachel Clayton, CNM

## 2017-06-30 ENCOUNTER — Ambulatory Visit (INDEPENDENT_AMBULATORY_CARE_PROVIDER_SITE_OTHER): Payer: Medicaid Other | Admitting: Advanced Practice Midwife

## 2017-06-30 ENCOUNTER — Encounter: Payer: Self-pay | Admitting: Advanced Practice Midwife

## 2017-06-30 ENCOUNTER — Other Ambulatory Visit: Payer: Self-pay | Admitting: Advanced Practice Midwife

## 2017-06-30 ENCOUNTER — Ambulatory Visit (INDEPENDENT_AMBULATORY_CARE_PROVIDER_SITE_OTHER): Payer: Medicaid Other

## 2017-06-30 VITALS — BP 134/88 | Wt 264.0 lb

## 2017-06-30 DIAGNOSIS — Z3A37 37 weeks gestation of pregnancy: Secondary | ICD-10-CM

## 2017-06-30 DIAGNOSIS — O0993 Supervision of high risk pregnancy, unspecified, third trimester: Secondary | ICD-10-CM

## 2017-06-30 DIAGNOSIS — Z362 Encounter for other antenatal screening follow-up: Secondary | ICD-10-CM | POA: Diagnosis not present

## 2017-06-30 DIAGNOSIS — O0990 Supervision of high risk pregnancy, unspecified, unspecified trimester: Secondary | ICD-10-CM

## 2017-06-30 NOTE — Patient Instructions (Signed)
Vaginal Delivery Vaginal delivery means that you will give birth by pushing your baby out of your birth canal (vagina). A team of health care providers will help you before, during, and after vaginal delivery. Birth experiences are unique for every woman and every pregnancy, and birth experiences vary depending on where you choose to give birth. What should I do to prepare for my baby's birth? Before your baby is born, it is important to talk with your health care provider about:  Your labor and delivery preferences. These may include: ? Medicines that you may be given. ? How you will manage your pain. This might include non-medical pain relief techniques or injectable pain relief such as epidural analgesia. ? How you and your baby will be monitored during labor and delivery. ? Who may be in the labor and delivery room with you. ? Your feelings about surgical delivery of your baby (cesarean delivery, or C-section) if this becomes necessary. ? Your feelings about receiving donated blood through an IV tube (blood transfusion) if this becomes necessary.  Whether you are able: ? To take pictures or videos of the birth. ? To eat during labor and delivery. ? To move around, walk, or change positions during labor and delivery.  What to expect after your baby is born, such as: ? Whether delayed umbilical cord clamping and cutting is offered. ? Who will care for your baby right after birth. ? Medicines or tests that may be recommended for your baby. ? Whether breastfeeding is supported in your hospital or birth center. ? How long you will be in the hospital or birth center.  How any medical conditions you have may affect your baby or your labor and delivery experience.  To prepare for your baby's birth, you should also:  Attend all of your health care visits before delivery (prenatal visits) as recommended by your health care provider. This is important.  Prepare your home for your baby's  arrival. Make sure that you have: ? Diapers. ? Baby clothing. ? Feeding equipment. ? Safe sleeping arrangements for you and your baby.  Install a car seat in your vehicle. Have your car seat checked by a certified car seat installer to make sure that it is installed safely.  Think about who will help you with your new baby at home for at least the first several weeks after delivery.  What can I expect when I arrive at the birth center or hospital? Once you are in labor and have been admitted into the hospital or birth center, your health care provider may:  Review your pregnancy history and any concerns you have.  Insert an IV tube into one of your veins. This is used to give you fluids and medicines.  Check your blood pressure, pulse, temperature, and heart rate (vital signs).  Check whether your bag of water (amniotic sac) has broken (ruptured).  Talk with you about your birth plan and discuss pain control options.  Monitoring Your health care provider may monitor your contractions (uterine monitoring) and your baby's heart rate (fetal monitoring). You may need to be monitored:  Often, but not continuously (intermittently).  All the time or for long periods at a time (continuously). Continuous monitoring may be needed if: ? You are taking certain medicines, such as medicine to relieve pain or make your contractions stronger. ? You have pregnancy or labor complications.  Monitoring may be done by:  Placing a special stethoscope or a handheld monitoring device on your abdomen to   check your baby's heartbeat, and feeling your abdomen for contractions. This method of monitoring does not continuously record your baby's heartbeat or your contractions.  Placing monitors on your abdomen (external monitors) to record your baby's heartbeat and the frequency and length of contractions. You may not have to wear external monitors all the time.  Placing monitors inside of your uterus  (internal monitors) to record your baby's heartbeat and the frequency, length, and strength of your contractions. ? Your health care provider may use internal monitors if he or she needs more information about the strength of your contractions or your baby's heart rate. ? Internal monitors are put in place by passing a thin, flexible wire through your vagina and into your uterus. Depending on the type of monitor, it may remain in your uterus or on your baby's head until birth. ? Your health care provider will discuss the benefits and risks of internal monitoring with you and will ask for your permission before inserting the monitors.  Telemetry. This is a type of continuous monitoring that can be done with external or internal monitors. Instead of having to stay in bed, you are able to move around during telemetry. Ask your health care provider if telemetry is an option for you.  Physical exam Your health care provider may perform a physical exam. This may include:  Checking whether your baby is positioned: ? With the head toward your vagina (head-down). This is most common. ? With the head toward the top of your uterus (head-up or breech). If your baby is in a breech position, your health care provider may try to turn your baby to a head-down position so you can deliver vaginally. If it does not seem that your baby can be born vaginally, your provider may recommend surgery to deliver your baby. In rare cases, you may be able to deliver vaginally if your baby is head-up (breech delivery). ? Lying sideways (transverse). Babies that are lying sideways cannot be delivered vaginally.  Checking your cervix to determine: ? Whether it is thinning out (effacing). ? Whether it is opening up (dilating). ? How low your baby has moved into your birth canal.  What are the three stages of labor and delivery?  Normal labor and delivery is divided into the following three stages: Stage 1  Stage 1 is the  longest stage of labor, and it can last for hours or days. Stage 1 includes: ? Early labor. This is when contractions may be irregular, or regular and mild. Generally, early labor contractions are more than 10 minutes apart. ? Active labor. This is when contractions get longer, more regular, more frequent, and more intense. ? The transition phase. This is when contractions happen very close together, are very intense, and may last longer than during any other part of labor.  Contractions generally feel mild, infrequent, and irregular at first. They get stronger, more frequent (about every 2-3 minutes), and more regular as you progress from early labor through active labor and transition.  Many women progress through stage 1 naturally, but you may need help to continue making progress. If this happens, your health care provider may talk with you about: ? Rupturing your amniotic sac if it has not ruptured yet. ? Giving you medicine to help make your contractions stronger and more frequent.  Stage 1 ends when your cervix is completely dilated to 4 inches (10 cm) and completely effaced. This happens at the end of the transition phase. Stage 2  Once   your cervix is completely effaced and dilated to 4 inches (10 cm), you may start to feel an urge to push. It is common for the body to naturally take a rest before feeling the urge to push, especially if you received an epidural or certain other pain medicines. This rest period may last for up to 1-2 hours, depending on your unique labor experience.  During stage 2, contractions are generally less painful, because pushing helps relieve contraction pain. Instead of contraction pain, you may feel stretching and burning pain, especially when the widest part of your baby's head passes through the vaginal opening (crowning).  Your health care provider will closely monitor your pushing progress and your baby's progress through the vagina during stage 2.  Your  health care provider may massage the area of skin between your vaginal opening and anus (perineum) or apply warm compresses to your perineum. This helps it stretch as the baby's head starts to crown, which can help prevent perineal tearing. ? In some cases, an incision may be made in your perineum (episiotomy) to allow the baby to pass through the vaginal opening. An episiotomy helps to make the opening of the vagina larger to allow more room for the baby to fit through.  It is very important to breathe and focus so your health care provider can control the delivery of your baby's head. Your health care provider may have you decrease the intensity of your pushing, to help prevent perineal tearing.  After delivery of your baby's head, the shoulders and the rest of the body generally deliver very quickly and without difficulty.  Once your baby is delivered, the umbilical cord may be cut right away, or this may be delayed for 1-2 minutes, depending on your baby's health. This may vary among health care providers, hospitals, and birth centers.  If you and your baby are healthy enough, your baby may be placed on your chest or abdomen to help maintain the baby's temperature and to help you bond with each other. Some mothers and babies start breastfeeding at this time. Your health care team will dry your baby and help keep your baby warm during this time.  Your baby may need immediate care if he or she: ? Showed signs of distress during labor. ? Has a medical condition. ? Was born too early (prematurely). ? Had a bowel movement before birth (meconium). ? Shows signs of difficulty transitioning from being inside the uterus to being outside of the uterus. If you are planning to breastfeed, your health care team will help you begin a feeding. Stage 3  The third stage of labor starts immediately after the birth of your baby and ends after you deliver the placenta. The placenta is an organ that develops  during pregnancy to provide oxygen and nutrients to your baby in the womb.  Delivering the placenta may require some pushing, and you may have mild contractions. Breastfeeding can stimulate contractions to help you deliver the placenta.  After the placenta is delivered, your uterus should tighten (contract) and become firm. This helps to stop bleeding in your uterus. To help your uterus contract and to control bleeding, your health care provider may: ? Give you medicine by injection, through an IV tube, by mouth, or through your rectum (rectally). ? Massage your abdomen or perform a vaginal exam to remove any blood clots that are left in your uterus. ? Empty your bladder by placing a thin, flexible tube (catheter) into your bladder. ? Encourage   you to breastfeed your baby. After labor is over, you and your baby will be monitored closely to ensure that you are both healthy until you are ready to go home. Your health care team will teach you how to care for yourself and your baby. This information is not intended to replace advice given to you by your health care provider. Make sure you discuss any questions you have with your health care provider. Document Released: 05/10/2008 Document Revised: 02/19/2016 Document Reviewed: 08/16/2015 Else[employer] Interactive Patient Education  2018 [employer]  

## 2017-06-30 NOTE — Progress Notes (Signed)
AFI/NST today  

## 2017-06-30 NOTE — Progress Notes (Addendum)
Routine Prenatal Care Visit  Subjective  Rachel Clayton is a 26 y.o. [phone] at 4877w3d being seen today for ongoing prenatal care.  She is currently monitored for the following issues for this high-risk pregnancy and has Supervision of high risk pregnancy, antepartum; Maternal obesity, antepartum; History of cesarean section complicating pregnancy; BMI 40.0-44.9, adult (HCC); and Abdominal pain during pregnancy, antepartum on their problem list.  ----------------------------------------------------------------------------------- Patient reports no complaints.    . Vag. Bleeding: None.  Movement: Present. Denies leaking of fluid. Occasional Deberah PeltonBraxton Hicks ----------------------------------------------------------------------------------- The following portions of the patient's history were reviewed and updated as appropriate: allergies, current medications, past family history, past medical history, past social history, past surgical history and problem list. Problem list updated.   Objective  Blood pressure 134/88, weight 264 lb (119.7 kg), last menstrual period 11/09/2016. Pregravid weight 245 lb (111.1 kg) Total Weight Gain 19 lb (8.618 kg) Urinalysis:   Urine Glucose: Negative  Fetal Status:     Movement: Present     NST: reactive 20 minute strip, 125 bpm baseline, moderate variability, +accelerations, -decelerations AFI: 10.6  General:  Alert, oriented and cooperative. Patient is in no acute distress.  Skin: Skin is warm and dry. No rash noted.   Cardiovascular: Normal heart rate noted  Respiratory: Normal respiratory effort, no problems with respiration noted  Abdomen: Soft, gravid, appropriate for gestational age.       Pelvic:  Cervical exam performed 2.5/60/-2  Extremities: Normal range of motion.     Mental Status: Normal mood and affect. Normal behavior. Normal judgment and thought content.   Assessment   26 y.o. [id number] at 5277w3d by  07/18/2017, by Ultrasound presenting for  routine prenatal visit  Plan   pregnancy 4 Problems (from 11/09/16 to present)    Problem Noted Resolved   BMI 40.0-44.9, adult (HCC) 02/01/2017 by Conard NovakJackson, Stephen D, MD No   Maternal obesity, antepartum 01/04/2017 by Vena AustriaStaebler, Andreas, MD No   Overview Addendum 02/01/2017 11:16 AM by Conard NovakJackson, Stephen D, MD    BMI >=40 [x]  early 1h gtt - results pending (drawn 6/20) [x]  u/s for dating  [x]  nutritional goals [x]  folic acid 1mg  [x]  bASA (>12 weeks) [ ]  consider nutrition consult [ ]  consider maternal EKG 1st trimester [ ]  Growth u/s 28 [ ] , 32 [ ] , 36 weeks [ ]  [ ]  NST/AFI weekly 36+ weeks (36 [x] , 37 [x] , 38 [] , 39[] , 40[] ) [ ]  IOL by 41 weeks (scheduled, prn [] )        History of cesarean section complicating pregnancy 01/04/2017 by Vena AustriaStaebler, Andreas, MD No   Overview Signed 01/04/2017  9:14 AM by Vena AustriaStaebler, Andreas, MD    G2 pregnancy with VBAC with G3      Supervision of high risk pregnancy, antepartum 12/14/2016 by Tresea MallGledhill, Tracyann Duffell, CNM No   Overview Addendum 06/23/2017  2:56 PM by Farrel ConnersGutierrez, Colleen, CNM    Clinic [facility] Prenatal Labs  Dating 11 week US Blood type: A/Positive/-- (05/23 0936)   Genetic Screen Quad: [X]  negative  Antibody:Negative (05/23 0936)  Anatomic US Normal female 03/01/17 Rubella: <0.90 (05/23 21300936) Varicella: I  GTT Early: 141 with 3-hr 82 / 150 / 127 / 110 Third trimester: 77 / 158 / 119 / 96 RPR: Non Reactive (05/23 0936)   Rhogam n/a HBsAg: Negative (05/23 0936)   TDaP vaccine  05/10/17          Flu Shot: HIV: Non Reactive (05/23 0936)   Baby Food  bottle                      WUJ:WJXBJYNWGBS:Negative  Contraception BTL, signed 30 day papers 06/23/2017 Pap: NIL 12/14/16  CBB   Baseline Labs:  UPC ratio 147,  TSH: 2.47 Hgba1c: 5.0 CMP normal  CS/VBAC Desires VBAC(1 successful)   Support Person                   Term labor symptoms and general obstetric precautions including but not limited to vaginal bleeding, contractions, leaking of fluid and fetal  movement were reviewed in detail with the patient. Please refer to After Visit Summary for other counseling recommendations.   Return in about 1 week (around 07/07/2017) for growth/afi scan, nst, rob.  Tresea MallJane Bettye Sitton, CNM  06/30/2017 1:58 PM

## 2017-07-11 ENCOUNTER — Ambulatory Visit (INDEPENDENT_AMBULATORY_CARE_PROVIDER_SITE_OTHER): Payer: Medicaid Other | Admitting: Obstetrics and Gynecology

## 2017-07-11 ENCOUNTER — Ambulatory Visit (INDEPENDENT_AMBULATORY_CARE_PROVIDER_SITE_OTHER): Payer: Medicaid Other

## 2017-07-11 ENCOUNTER — Encounter: Payer: Self-pay | Admitting: Obstetrics and Gynecology

## 2017-07-11 VITALS — BP 136/92 | Wt 262.0 lb

## 2017-07-11 DIAGNOSIS — Z3A39 39 weeks gestation of pregnancy: Secondary | ICD-10-CM

## 2017-07-11 DIAGNOSIS — O0990 Supervision of high risk pregnancy, unspecified, unspecified trimester: Secondary | ICD-10-CM

## 2017-07-11 DIAGNOSIS — O99210 Obesity complicating pregnancy, unspecified trimester: Secondary | ICD-10-CM

## 2017-07-11 DIAGNOSIS — Z6841 Body Mass Index (BMI) 40.0 and over, adult: Secondary | ICD-10-CM

## 2017-07-11 DIAGNOSIS — Z362 Encounter for other antenatal screening follow-up: Secondary | ICD-10-CM

## 2017-07-11 DIAGNOSIS — O34219 Maternal care for unspecified type scar from previous cesarean delivery: Secondary | ICD-10-CM

## 2017-07-11 NOTE — Progress Notes (Signed)
Routine Prenatal Care Visit  Subjective  Rachel Clayton is a 26 y.o. [phone] at 8875w0d being seen today for ongoing prenatal care.  She is currently monitored for the following issues for this high-risk pregnancy and has Supervision of high risk pregnancy, antepartum; Maternal obesity, antepartum; History of cesarean section complicating pregnancy; BMI 40.0-44.9, adult (HCC); and Abdominal pain during pregnancy, antepartum on their problem list.  ----------------------------------------------------------------------------------- Patient reports no complaints.   Contractions: Irregular. Vag. Bleeding: None.  Movement: Present. Denies leaking of fluid.  Growth 45th %ile, afi 16.7 cm.  Membranes stripped today. Patient still desires TOLAC.  Denies HA, visual changes, and RUQ pain. ----------------------------------------------------------------------------------- The following portions of the patient's history were reviewed and updated as appropriate: allergies, current medications, past family history, past medical history, past social history, past surgical history and problem list. Problem list updated.  Objective  Blood pressure (!) 136/92, weight 262 lb (118.8 kg), last menstrual period 11/09/2016. BP recheck 134/84 Pregravid weight 245 lb (111.1 kg) Total Weight Gain 17 lb (7.711 kg) Urinalysis:      Fetal Status: Fetal Heart Rate (bpm): 135   Movement: Present     General:  Alert, oriented and cooperative. Patient is in no acute distress.  Skin: Skin is warm and dry. No rash noted.   Cardiovascular: Normal heart rate noted  Respiratory: Normal respiratory effort, no problems with respiration noted  Abdomen: Soft, gravid, appropriate for gestational age. Pain/Pressure: Absent     Pelvic:  Cervical exam deferred Dilation: 2.5 Effacement (%): 50 Station: -3  Extremities: Normal range of motion.     Mental Status: Normal mood and affect. Normal behavior. Normal judgment and thought  content.   NST Baseline FHR: 135 beats/min Variability: moderate Accelerations: present Decelerations: present (a single variable deceleration that was barely in the range of being a variable. It did not recur and the return to baseline was fast with moderate variability and accelerations after. The depth of the variable was minimally qualified to be a variable (to 110)). Tocometry: not done  Interpretation:  INDICATIONS: obesity with BMI > 40 RESULTS:  A NST procedure was performed with FHR monitoring and a normal baseline established, appropriate time of 20-40 minutes of evaluation, and accels >2 seen w 15x15 characteristics.  Results show a REACTIVE NST.    Assessment   26 y.o. [id number] at 5175w0d by  07/18/2017, by Ultrasound presenting for routine prenatal visit  Plan   pregnancy 4 Problems (from 11/09/16 to present)    Problem Noted Resolved   BMI 40.0-44.9, adult (HCC) 02/01/2017 by Conard NovakJackson, Jennel Mara D, MD No   Maternal obesity, antepartum 01/04/2017 by Vena AustriaStaebler, Andreas, MD No   Overview Addendum 02/01/2017 11:16 AM by Conard NovakJackson, Dinesh Ulysse D, MD    BMI >=40 [x]  early 1h gtt - results pending (drawn 6/20) [x]  u/s for dating  [x]  nutritional goals [x]  folic acid 1mg  [x]  bASA (>12 weeks) [ ]  consider nutrition consult [ ]  consider maternal EKG 1st trimester [ ]  Growth u/s 28 [x] , 32 [x] , 36 weeks [x]  [ ]  NST/AFI weekly 36+ weeks (36[x] , 37[x] , 38[x] , 39[x] , 40[] ) [ ]  IOL by 41 weeks (scheduled, prn [] )       History of cesarean section complicating pregnancy 01/04/2017 by Vena AustriaStaebler, Andreas, MD No   Overview Signed 01/04/2017  9:14 AM by Vena AustriaStaebler, Andreas, MD    G2 pregnancy with VBAC with G3     Supervision of high risk pregnancy, antepartum 12/14/2016 by Tresea MallGledhill, Jane, CNM No   Overview Addendum 06/23/2017  2:56 PM by Farrel ConnersGutierrez, Colleen, CNM    Clinic [facility] Prenatal Labs  Dating 11 week US Blood type: A/Positive/-- (05/23 0936)   Genetic Screen Quad: [X]  negative   Antibody:Negative (05/23 0936)  Anatomic US Normal female 03/01/17 Rubella: <0.90 (05/23 54090936) Varicella: I  GTT Early: 141 with 3-hr 82 / 150 / 127 / 110 Third trimester: 77 / 158 / 119 / 96 RPR: Non Reactive (05/23 0936)   Rhogam n/a HBsAg: Negative (05/23 0936)   TDaP vaccine  05/10/17          Flu Shot: HIV: Non Reactive (05/23 0936)   Baby Food          bottle                      WJX:BJYNWGNFGBS:Negative  Contraception BTL, signed 30 day papers 06/23/2017 Pap: NIL 12/14/16  CBB   Baseline Labs:  UPC ratio 147,  TSH: 2.47 Hgba1c: 5.0 CMP normal  CS/VBAC Desires VBAC(1 successful)   Support Person               Term labor symptoms and general obstetric precautions including but not limited to vaginal bleeding, contractions, leaking of fluid and fetal movement were reviewed in detail with the patient. Please refer to After Visit Summary for other counseling recommendations.   Return in about 1 week (around 07/18/2017) for u/s for AFI and routine prenatal with NST.  Plan for IOL at next visit, if not already delivered.   Thomasene MohairStephen Dontavian Marchi, MD  07/11/2017 11:31 AM

## 2017-07-15 ENCOUNTER — Inpatient Hospital Stay: Payer: Medicaid Other | Admitting: Anesthesiology

## 2017-07-15 ENCOUNTER — Other Ambulatory Visit: Payer: Self-pay

## 2017-07-15 ENCOUNTER — Inpatient Hospital Stay
Admission: EM | Admit: 2017-07-15 | Discharge: 2017-07-17 | DRG: 806 | Disposition: A | Discharge status: Home / Self Care | Payer: Medicaid Other | Attending: Obstetrics and Gynecology | Admitting: Obstetrics and Gynecology

## 2017-07-15 DIAGNOSIS — Z6841 Body Mass Index (BMI) 40.0 and over, adult: Secondary | ICD-10-CM

## 2017-07-15 DIAGNOSIS — Z3483 Encounter for supervision of other normal pregnancy, third trimester: Secondary | ICD-10-CM | POA: Diagnosis present

## 2017-07-15 DIAGNOSIS — O34219 Maternal care for unspecified type scar from previous cesarean delivery: Secondary | ICD-10-CM | POA: Diagnosis present

## 2017-07-15 DIAGNOSIS — O99214 Obesity complicating childbirth: Secondary | ICD-10-CM | POA: Diagnosis present

## 2017-07-15 DIAGNOSIS — Z3A39 39 weeks gestation of pregnancy: Secondary | ICD-10-CM

## 2017-07-15 DIAGNOSIS — D62 Acute posthemorrhagic anemia: Secondary | ICD-10-CM | POA: Diagnosis not present

## 2017-07-15 DIAGNOSIS — O99210 Obesity complicating pregnancy, unspecified trimester: Secondary | ICD-10-CM | POA: Diagnosis present

## 2017-07-15 DIAGNOSIS — E669 Obesity, unspecified: Secondary | ICD-10-CM | POA: Diagnosis present

## 2017-07-15 DIAGNOSIS — O9081 Anemia of the puerperium: Secondary | ICD-10-CM | POA: Diagnosis not present

## 2017-07-15 DIAGNOSIS — O0990 Supervision of high risk pregnancy, unspecified, unspecified trimester: Secondary | ICD-10-CM

## 2017-07-15 LAB — CBC
HCT: 34.4 % — ABNORMAL LOW (ref 35.0–47.0)
Hemoglobin: 11.4 g/dL — ABNORMAL LOW (ref 12.0–16.0)
MCH: 24.1 pg — AB (ref 26.0–34.0)
MCHC: 33 g/dL (ref 32.0–36.0)
MCV: 73.1 fL — AB (ref 80.0–100.0)
PLATELETS: 226 10*3/uL (ref 150–440)
RBC: 4.71 MIL/uL (ref 3.80–5.20)
RDW: 16 % — AB (ref 11.5–14.5)
WBC: 14.4 10*3/uL — ABNORMAL HIGH (ref 3.6–11.0)

## 2017-07-15 LAB — TYPE AND SCREEN
ABO/RH(D): A POS
Antibody Screen: NEGATIVE

## 2017-07-15 MED ORDER — LACTATED RINGERS IV SOLN
500.0000 mL | INTRAVENOUS | Status: DC | PRN
  Administered 2017-07-15: 1000 mL via INTRAVENOUS

## 2017-07-15 MED ORDER — SOD CITRATE-CITRIC ACID 500-334 MG/5ML PO SOLN: 30.0000 mL | ORAL | Status: DC | PRN

## 2017-07-15 MED ORDER — FENTANYL 2.5 MCG/ML W/ROPIVACAINE 0.15% IN NS 100 ML EPIDURAL (ARMC)
EPIDURAL | Status: AC
  Filled 2017-07-15: qty 100

## 2017-07-15 MED ORDER — ONDANSETRON HCL 4 MG/2ML IJ SOLN: 4.0000 mg | Freq: Four times a day (QID) | INTRAMUSCULAR | Status: DC | PRN

## 2017-07-15 MED ORDER — OXYTOCIN 40 UNITS IN LACTATED RINGERS INFUSION - SIMPLE MED
2.5000 [IU]/h | INTRAVENOUS | Status: DC
  Administered 2017-07-16: 2.5 [IU]/h via INTRAVENOUS
  Filled 2017-07-15: qty 1000

## 2017-07-15 MED ORDER — OXYTOCIN BOLUS FROM INFUSION
500.0000 mL | Freq: Once | INTRAVENOUS | Status: AC
  Administered 2017-07-16: 500 mL via INTRAVENOUS

## 2017-07-15 MED ORDER — LACTATED RINGERS IV SOLN
INTRAVENOUS | Status: DC
  Administered 2017-07-15: 23:00:00 via INTRAVENOUS

## 2017-07-15 MED ORDER — OXYTOCIN 10 UNIT/ML IJ SOLN: 10.0000 [IU] | Freq: Once | INTRAMUSCULAR | Status: DC

## 2017-07-15 MED ORDER — LIDOCAINE HCL (PF) 1 % IJ SOLN: 30.0000 mL | INTRAMUSCULAR | Status: DC | PRN

## 2017-07-15 NOTE — H&P (Signed)
OB History & Physical   History of Present Illness:  Chief Complaint: contractions  HPI:  Rachel Clayton is a 26 y.o. [phone] female at 2371w4d dated by 11 week ultrasound.  Her pregnancy has been complicated by obesity with BMI > 40, history of cesarean delivery with G2 (G1 and G3 were vaginal deliveries).    She reports contractions.   She denies leakage of fluid.   She denies vaginal bleeding.   She reports fetal movement.    Maternal Medical History:   Past Medical History:  Diagnosis Date  . Asthma   . BMI 40.0-44.9, adult (HCC)   . History of oligohydramnios   . Medical history non-contributory   . Recurrent UTI     Past Surgical History:  Procedure Laterality Date  . CESAREAN SECTION    . HAND SURGERY      No Known Allergies  Prior to Admission medications   Not on File    OB History  Gravida Para Term Preterm AB Living  4 3 3     3   SAB TAB Ectopic Multiple Live Births          3    # Outcome Date GA Lbr Len/2nd Weight Sex Delivery Anes PTL Lv  4 Current           3 Term 07/21/14 5138w6d  6 lb 5 oz (2.863 kg) M Vag-Spont   LIV  2 Term 07/15/11 3119w0d  7 lb 10 oz (3.459 kg) M CS-LTranv   LIV     Complications: Arrest of descent, delivered, current hospitalization  1 Term 11/30/06 6619w0d  7 lb 8 oz (3.402 kg) M Vag-Spont   LIV      Prenatal care site: [facility]  Social History: She  reports that  has never smoked. she has never used smokeless tobacco. She reports that she does not drink alcohol or use drugs.  Family History: family history includes Hypertension in her mother.   Review of Systems:  Review of Systems  Constitutional: Negative.   HENT: Negative.   Eyes: Negative.   Respiratory: Negative.   Cardiovascular: Negative.   Gastrointestinal: Positive for abdominal pain (contractions). Negative for heartburn, nausea and vomiting.  Genitourinary: Negative.   Musculoskeletal: Negative.   Skin: Negative.   Neurological: Negative.    Psychiatric/Behavioral: Negative.      Physical Exam:  Vital Signs: BP 137/80 (BP Location: Left Arm)   Pulse 86   Temp 98.1 F (36.7 C) (Oral)   Resp 20   Ht 5\' 4"  (1.626 m)   Wt 262 lb (118.8 kg)   LMP 11/09/2016   BMI 44.97 kg/m  Constitutional: Well nourished, well developed female in no acute distress.  HEENT: normal Skin: Warm and dry.  Cardiovascular: Regular rate and rhythm.   Extremity: no edema  Respiratory: Clear to auscultation bilateral. Normal respiratory effort Abdomen: FHT present and obese, ttp Back: no CVAT Neuro: DTRs 2+, Cranial nerves grossly intact Psych: Alert and Oriented x3. No memory deficits. Normal mood and affect.  MS: normal gait, normal bilateral lower extremity ROM/strength/stability.  Pelvic exam: cervix, per RN (2.5 -> 3.5 -> 4.5 over 2-2-5 hours)  Pertinent Results:  Prenatal Labs: Blood type/Rh A positive  Antibody screen negative  Rubella Not immune  Varicella Immune    RPR NR  HBsAg negative  HIV negative  GC negative  Chlamydia negative  Genetic screening Second trimester quad screen neg  1 hour GTT Failed early 1h gtt (141) passed early  and 28 week 3 hour gtts (all values normal)  GBS negative on 06/21/17   Baseline FHR: 130 beats/min   Variability: moderate   Accelerations: present   Decelerations: absent Contractions: present frequency: 3-4 q 10 min Overall assessment: cat 1  Assessment:  Rachel Clayton is a 26 y.o. [phone] female at 1833w4d with active labor.  Desires TOLAC.   Plan:  1. Admit to Labor & Delivery  2. CBC, T&S, Clrs, IVF 3. GBS negative.   4. Fetwal well-being: reassuring 5. TOLAC:   Risk of uterine rupture at term is 0.78 percent with TOLAC and 0.22 percent with ERCD. 1 in 10 uterine ruptures will result in neonatal death or neurological injury. The benefits of a trial of labor after cesarean (TOLAC) resulting in a vaginal birth after cesarean (VBAC) include the following: shorter length of hospital  stay and postpartum recovery (in most cases); fewer complications, such as postpartum fever, wound or uterine infection, thromboembolism (blood clots in the leg or lung), need for blood transfusion and fewer neonatal breathing problems. The risks of an attempted VBAC or TOLAC include the following: Risk of failed trial of labor after cesarean (TOLAC) without a vaginal birth after cesarean (VBAC) resulting in repeat cesarean delivery (RCD) in about 20 to 40 percent of women who attempt VBAC.  Her individualized success rate using the MFMU VBAC calculator is 91%. Risk of rupture of uterus resulting in an emergency cesarean delivery. The risk of uterine rupture may be related in part to the type of uterine incision made during the first cesarean delivery. A previous transverse uterine incision has the lowest risk of rupture (0.2 to 1.5 percent risk). Vertical or T-shaped uterine incisions have a higher risk of uterine rupture (4 to 9 percent risk)The risk of fetal death is very low with both VBAC and elective repeat cesarean delivery (ERCD), but the likelihood of fetal death is higher with VBAC than with ERCD. Maternal death is very rare with either type of delivery. The risks of an elective repeat cesarean delivery (ERCD) were reviewed with the patient including but not limited to: 09/998 risk of uterine rupture which could have serious consequences, bleeding which may require transfusion; infection which may require antibiotics; injury to bowel, bladder or other surrounding organs (bowel, bladder, ureters); injury to the fetus; need for additional procedures including hysterectomy in the event of a life-threatening hemorrhage; thromboembolic phenomenon; abnormal placentation; incisional problems; death and other postoperative or anesthesia complications.    The patient is in natural labor and has a very favorable bishop score at this time. Per hospital policy the VBAC protocol has been initiated.  Patient has  signed consent and does wish to continue with TOLAC at this time.   Thomasene MohairStephen Jackson, MD 07/15/2017 10:39 PM

## 2017-07-15 NOTE — Progress Notes (Signed)
VBAC protocal initiated. All calls made to necessary staff to notify of initiation. MD at bedside with patient.

## 2017-07-15 NOTE — OB Triage Note (Signed)
Pt arrived to unit c/o contractions starting approximately 1 hour ago. +FM. Denies leaking of fluid or vaginal bleeding or headache. FM applied.

## 2017-07-16 ENCOUNTER — Other Ambulatory Visit: Payer: Self-pay

## 2017-07-16 DIAGNOSIS — O34219 Maternal care for unspecified type scar from previous cesarean delivery: Secondary | ICD-10-CM | POA: Diagnosis not present

## 2017-07-16 LAB — CHLAMYDIA/NGC RT PCR (ARMC ONLY)
CHLAMYDIA TR: NOT DETECTED
N GONORRHOEAE: NOT DETECTED

## 2017-07-16 MED ORDER — HYDROCODONE-ACETAMINOPHEN 5-325 MG PO TABS: 1.0000 | ORAL_TABLET | Freq: Four times a day (QID) | ORAL | Status: DC | PRN

## 2017-07-16 MED ORDER — ONDANSETRON HCL 4 MG/2ML IJ SOLN: 4.0000 mg | INTRAMUSCULAR | Status: DC | PRN

## 2017-07-16 MED ORDER — SODIUM CHLORIDE 0.9 % IV SOLN
INTRAVENOUS | Status: DC | PRN
  Administered 2017-07-16: 3 mL via EPIDURAL
  Administered 2017-07-16: 4 mL via EPIDURAL
  Administered 2017-07-16: 3 mL via EPIDURAL

## 2017-07-16 MED ORDER — ACETAMINOPHEN 325 MG PO TABS: 650.0000 mg | ORAL_TABLET | ORAL | Status: DC | PRN

## 2017-07-16 MED ORDER — DIBUCAINE 1 % RE OINT: 1.0000 "application " | TOPICAL_OINTMENT | RECTAL | Status: DC | PRN

## 2017-07-16 MED ORDER — OXYTOCIN 10 UNIT/ML IJ SOLN
INTRAMUSCULAR | Status: AC
  Filled 2017-07-16: qty 2

## 2017-07-16 MED ORDER — FERROUS SULFATE 325 (65 FE) MG PO TABS
325.0000 mg | ORAL_TABLET | Freq: Two times a day (BID) | ORAL | Status: DC
  Administered 2017-07-16 – 2017-07-17 (×2): 325 mg via ORAL
  Filled 2017-07-16 (×3): qty 1

## 2017-07-16 MED ORDER — ENOXAPARIN SODIUM 40 MG/0.4ML ~~LOC~~ SOLN
40.0000 mg | SUBCUTANEOUS | Status: DC
  Administered 2017-07-17: 40 mg via SUBCUTANEOUS
  Filled 2017-07-16: qty 0.4

## 2017-07-16 MED ORDER — BENZOCAINE-MENTHOL 20-0.5 % EX AERO: 1.0000 "application " | INHALATION_SPRAY | CUTANEOUS | Status: DC | PRN

## 2017-07-16 MED ORDER — ONDANSETRON HCL 4 MG PO TABS: 4.0000 mg | ORAL_TABLET | ORAL | Status: DC | PRN

## 2017-07-16 MED ORDER — AMMONIA AROMATIC IN INHA
RESPIRATORY_TRACT | Status: AC
  Filled 2017-07-16: qty 10

## 2017-07-16 MED ORDER — FENTANYL 2.5 MCG/ML W/ROPIVACAINE 0.15% IN NS 100 ML EPIDURAL (ARMC)
EPIDURAL | Status: DC | PRN
  Administered 2017-07-16: 12 mL/h via EPIDURAL

## 2017-07-16 MED ORDER — COCONUT OIL OIL: 1.0000 "application " | TOPICAL_OIL | Status: DC | PRN

## 2017-07-16 MED ORDER — WITCH HAZEL-GLYCERIN EX PADS: 1.0000 "application " | MEDICATED_PAD | CUTANEOUS | Status: DC | PRN

## 2017-07-16 MED ORDER — MISOPROSTOL 200 MCG PO TABS
ORAL_TABLET | ORAL | Status: AC
  Filled 2017-07-16: qty 4

## 2017-07-16 MED ORDER — METHYLERGONOVINE MALEATE 0.2 MG/ML IJ SOLN
INTRAMUSCULAR | Status: AC
  Filled 2017-07-16: qty 1

## 2017-07-16 MED ORDER — DIPHENHYDRAMINE HCL 25 MG PO CAPS: 25.0000 mg | ORAL_CAPSULE | Freq: Four times a day (QID) | ORAL | Status: DC | PRN

## 2017-07-16 MED ORDER — LIDOCAINE HCL (PF) 1 % IJ SOLN
INTRAMUSCULAR | Status: DC | PRN
  Administered 2017-07-16: 4 mL via SUBCUTANEOUS

## 2017-07-16 MED ORDER — MEASLES, MUMPS & RUBELLA VAC ~~LOC~~ INJ: 0.5000 mL | INJECTION | SUBCUTANEOUS | Status: DC | PRN

## 2017-07-16 MED ORDER — SENNOSIDES-DOCUSATE SODIUM 8.6-50 MG PO TABS
2.0000 | ORAL_TABLET | ORAL | Status: DC
  Administered 2017-07-16: 2 via ORAL
  Filled 2017-07-16: qty 2

## 2017-07-16 MED ORDER — SIMETHICONE 80 MG PO CHEW: 80.0000 mg | CHEWABLE_TABLET | ORAL | Status: DC | PRN

## 2017-07-16 MED ORDER — PRENATAL MULTIVITAMIN CH
1.0000 | ORAL_TABLET | Freq: Every day | ORAL | Status: DC
  Administered 2017-07-16: 1 via ORAL
  Filled 2017-07-16: qty 1

## 2017-07-16 MED ORDER — LIDOCAINE HCL (PF) 1 % IJ SOLN
INTRAMUSCULAR | Status: AC
  Filled 2017-07-16: qty 30

## 2017-07-16 MED ORDER — IBUPROFEN 600 MG PO TABS
600.0000 mg | ORAL_TABLET | Freq: Four times a day (QID) | ORAL | Status: DC
  Administered 2017-07-16 – 2017-07-17 (×5): 600 mg via ORAL
  Filled 2017-07-16 (×5): qty 1

## 2017-07-16 NOTE — Progress Notes (Signed)
Patient ID: Rachel Clayton, female   DOB: 1991-01-26, 26 y.o.   MRN: [id number]  Labor Check  Subj:  Complaints: still with pressure with ctx   Obj:  BP 134/74 (BP Location: Left Arm)   Pulse (!) 103   Temp 97.9 F (36.6 C) (Oral)   Resp 20   Ht 5\' 4"  (1.626 m)   Wt 262 lb (118.8 kg)   LMP 11/09/2016   BMI 44.97 kg/m     Cervix: rim/100%/0 Baseline FHR: 130 beats/min   Variability: moderate   Accelerations: present   Decelerations: present (occasional variables w ctx down to 90s with quick return to baseline)  AROM: port-wine colored fluid, no evidence of meconium Contractions: present frequency: 3-4 q 10 min Overall assessment: cat 2  A/P: 26 y.o. [id number] female at 4939w5d with active labor, TOLAC.  1.  Labor: continue expectant management  2.  FWB: reassuring, Overall assessment: category 2 (reassuring overall with expecting delivery soon)  3.  GBS neg  4.  Pain: epidural 5.  Recheck: as needed. Expect delivery soon.   Thomasene MohairStephen Katie Faraone, MD 07/16/2017 1:02 AM

## 2017-07-16 NOTE — Progress Notes (Signed)
Patient ID: Rachel Clayton, female   DOB: 09/01/1990, 26 y.o.   MRN: [id number]  Obstetric Postpartum Daily Progress Note Subjective:  26 y.o. [id number] postpartum day #0 status post successful VBAC.  She is ambulating, is tolerating po, is voiding spontaneously.  Her pain is well controlled on PO pain medications. Her lochia is less than menses.   Medications SCHEDULED MEDICATIONS  . [START ON 07/17/2017] enoxaparin (LOVENOX) injection  40 mg Subcutaneous Q24H  . ferrous sulfate  325 mg Oral BID WC  . ibuprofen  600 mg Oral Q6H  . misoprostol      . oxytocin      . prenatal multivitamin  1 tablet Oral Q1200  . [START ON 07/17/2017] senna-docusate  2 tablet Oral Q24H    MEDICATION INFUSIONS    PRN MEDICATIONS  acetaminophen, benzocaine-Menthol, coconut oil, witch hazel-glycerin **AND** dibucaine, diphenhydrAMINE, HYDROcodone-acetaminophen, measles, mumps and rubella vaccine, ondansetron **OR** ondansetron (ZOFRAN) IV, simethicone    Objective:   Vitals:   07/16/17 0328 07/16/17 0409 07/16/17 0456 07/16/17 0733  BP: 113/67 116/65 120/63 120/63  Pulse: 91 89 99 93  Resp:  (!) 21 18 20   Temp:  98 F (36.7 C) 98.1 F (36.7 C) 98.2 F (36.8 C)  TempSrc:  Oral Oral Oral  SpO2:  98% 100% 99%  Weight:      Height:        Current Vital Signs 24h Vital Sign Ranges  T 98.2 F (36.8 C) Temp  Avg: 98.1 F (36.7 C)  Min: 97.9 F (36.6 C)  Max: 98.2 F (36.8 C)  BP 120/63 BP  Min: 113/67  Max: 152/81  HR 93 Pulse  Avg: 101.5  Min: 80  Max: 135  RR 20 Resp  Avg: 19.8  Min: 18  Max: 21  SaO2 99 % Not Delivered SpO2  Avg: 99.3 %  Min: 98 %  Max: 100 %       24 Hour I/O Current Shift I/O  Time Ins Outs 12/01 0701 - 12/02 0700 In: 1600 [P.O.:360; I.V.:1240] Out: 1280 [Urine:900] No intake/output data recorded.  General: NAD Pulmonary: no increased work of breathing Abdomen: non-distended, non-tender, fundus firm at level of umbilicus Extremities: no edema, no erythema, no  tenderness  Labs:  Recent Labs  Lab 07/15/17 2300  WBC 14.4*  HGB 11.4*  HCT 34.4*  PLT 226     Assessment:   26 y.o. [id number] postpartum day # 0 status post successful VBAC  Plan:   1) A POS / Rubella <0.90 (05/23 0936)/ Varicella Immune  3) TDAP status received 05/10/17, declined flu vaccine  4) formula feeding /Contraception = plans BTL  5) Disposition: home PPD#1-2  Thomasene MohairStephen Jackson, MD 07/16/2017 10:54 AM

## 2017-07-16 NOTE — Discharge Summary (Signed)
OB Discharge Summary     Patient Name: Rachel Clayton DOB: [birth year] MRN: [id number]  Date of admission: 07/15/2017 Delivering MD: Thomasene MohairStephen Jackson, MD  Date of Delivery: 07/16/2017  Date of discharge: 07/17/2017  Admitting diagnosis: 39 weeks contracting Intrauterine pregnancy: 3535w5d     Secondary diagnosis: TOLAC     Discharge diagnosis: Term Pregnancy Delivered and VBAC                                                                                                Post partum procedures: none  Augmentation: none  Complications: None  Hospital course:  Onset of Labor With Vaginal Delivery     26 y.o. yo [phone] [id number] at 6935w5d was admitted in Active Labor on 07/15/2017. Patient had an uncomplicated labor course as follows:  Membrane Rupture Time/Date: 1:00 AM ,07/16/2017   Intrapartum Procedures: Episiotomy: None [1]                                         Lacerations:     Patient had a delivery of a Viable infant. 07/16/2017  Information for the patient's newborn:  Rhetta MuraGooch, Girl Demetra [[id number]][[id number]]  Delivery Method: Vag-Spont    Pateint had an uncomplicated postpartum course.  She is ambulating, tolerating a regular diet, passing flatus, and urinating well. Patient is discharged home in stable condition on 07/17/17.   Physical exam  Vitals:   07/16/17 1131 07/16/17 1936 07/17/17 0000 07/17/17 0746  BP: 115/60 103/78 127/71 126/75  Pulse: 75 84 84 82  Resp: 18 18 18    Temp: 98.2 F (36.8 C) 98.4 F (36.9 C) 98 F (36.7 C) 97.8 F (36.6 C)  TempSrc: Oral Oral Oral Oral  SpO2: 98% 98%  100%  Weight:      Height:       General: alert, cooperative and no distress Lochia: appropriate Uterine Fundus: firm Incision: N/A DVT Evaluation: No evidence of DVT seen on physical exam. No cords or calf tenderness. No significant calf/ankle edema.  Labs: Lab Results  Component Value Date   WBC 10.7 07/17/2017   HGB 9.0 (L) 07/17/2017   HCT 26.8 (L) 07/17/2017   MCV 73.5 (L) 07/17/2017    PLT 166 07/17/2017    Discharge instruction: per After Visit Summary.  Medications:  Allergies as of 07/17/2017   No Known Allergies     Medication List    TAKE these medications   ferrous sulfate 325 (65 FE) MG tablet Take 1 tablet (325 mg total) by mouth 2 (two) times daily with a meal.       Diet: routine diet  Activity: Advance as tolerated. Pelvic rest for 6 weeks.   Outpatient follow up: Follow-up Information    Conard NovakJackson, Stephen D, MD Follow up in 2 week(s).   Specialty:  Obstetrics and Gynecology Why:  postpartum follow up, schedule interval BTL Contact information: [address] [place] [address] [phone]             Postpartum contraception: Tubal Ligation Rhogam  Given postpartum: no Rubella vaccine given postpartum: yes Varicella vaccine given postpartum: no TDaP given antepartum or postpartum: AP  Newborn Data: Live born child  Birth Weight: 3250 g APGAR: 8, 9   Newborn Delivery   Birth date/time:  07/16/2017 01:12:00 Delivery type:  Vaginal, Spontaneous    Baby Feeding: Formula  Disposition: home with mother  SIGNED: Marcelyn BruinsJacelyn Tex Conroy, CNM 07/17/2017  9:20 AM

## 2017-07-16 NOTE — Anesthesia Procedure Notes (Signed)
Epidural Patient location during procedure: OB Start time: 07/16/2017 12:09 AM End time: 07/16/2017 12:22 AM  Staffing Anesthesiologist: Lenard SimmerKarenz, Caylie Sandquist, MD Performed: anesthesiologist   Preanesthetic Checklist Completed: patient identified, site marked, surgical consent, pre-op evaluation, timeout performed, IV checked, risks and benefits discussed and monitors and equipment checked  Epidural Patient position: sitting Prep: ChloraPrep Patient monitoring: heart rate, continuous pulse ox and blood pressure Approach: midline Location: L3-L4 Injection technique: LOR saline  Needle:  Needle type: Tuohy  Needle gauge: 17 G Needle length: 9 cm and 9 Needle insertion depth: 9 cm Catheter type: closed end flexible Catheter size: 19 Gauge Catheter at skin depth: 15 cm Test dose: negative and 1.5% lidocaine with Epi 1:200 K  Assessment Sensory level: T10 Events: blood not aspirated, injection not painful, no injection resistance, negative IV test and no paresthesia  Additional Notes Pt. Evaluated and documentation done after procedure finished. Patient identified. Risks/Benefits/Options discussed with patient including but not limited to bleeding, infection, nerve damage, paralysis, failed block, incomplete pain control, headache, blood pressure changes, nausea, vomiting, reactions to medication both or allergic, itching and postpartum back pain. Confirmed with bedside nurse the patient's most recent platelet count. Confirmed with patient that they are not currently taking any anticoagulation, have any bleeding history or any family history of bleeding disorders. Patient expressed understanding and wished to proceed. All questions were answered. Sterile technique was used throughout the entire procedure. Please see nursing notes for vital signs. Test dose was given through epidural catheter and negative prior to continuing to dose epidural or start infusion. Warning signs of high block given  to the patient including shortness of breath, tingling/numbness in hands, complete motor block, or any concerning symptoms with instructions to call for help. Patient was given instructions on fall risk and not to get out of bed. All questions and concerns addressed with instructions to call with any issues or inadequate analgesia.   Patient tolerated the insertion well without immediate complications.Reason for block:procedure for pain

## 2017-07-16 NOTE — Anesthesia Postprocedure Evaluation (Signed)
Anesthesia Post Note  Patient: Clinical cytogeneticistAmber N Marlatt  Procedure(s) Performed: AN AD HOC LABOR EPIDURAL  Patient location during evaluation: Mother Baby Anesthesia Type: Epidural Level of consciousness: awake and alert and oriented Pain management: pain level controlled Vital Signs Assessment: post-procedure vital signs reviewed and stable Respiratory status: spontaneous breathing, nonlabored ventilation and respiratory function stable Cardiovascular status: stable Postop Assessment: no headache, no backache, epidural receding and no signs of nausea or vomiting (no pruritis) Anesthetic complications: no     Last Vitals:  Vitals:   07/16/17 0456 07/16/17 0733  BP: 120/63 120/63  Pulse: 99 93  Resp: 18 20  Temp: 36.7 C 36.8 C  SpO2: 100% 99%    Last Pain:  Vitals:   07/16/17 0733  TempSrc: Oral  PainSc:                  Rashawn Rolon

## 2017-07-17 LAB — CBC
HCT: 26.8 % — ABNORMAL LOW (ref 35.0–47.0)
Hemoglobin: 9 g/dL — ABNORMAL LOW (ref 12.0–16.0)
MCH: 24.6 pg — AB (ref 26.0–34.0)
MCHC: 33.5 g/dL (ref 32.0–36.0)
MCV: 73.5 fL — AB (ref 80.0–100.0)
PLATELETS: 166 10*3/uL (ref 150–440)
RBC: 3.66 MIL/uL — ABNORMAL LOW (ref 3.80–5.20)
RDW: 15.7 % — AB (ref 11.5–14.5)
WBC: 10.7 10*3/uL (ref 3.6–11.0)

## 2017-07-17 LAB — RPR: RPR: NONREACTIVE

## 2017-07-17 MED ORDER — FERROUS SULFATE 325 (65 FE) MG PO TABS: 325.0000 mg | ORAL_TABLET | Freq: Two times a day (BID) | ORAL | 3 refills | Status: DC

## 2017-07-17 NOTE — Anesthesia Postprocedure Evaluation (Signed)
Anesthesia Post Note  Patient: Clinical cytogeneticistAmber N Clayton  Procedure(s) Performed: AN AD HOC LABOR EPIDURAL  Patient location during evaluation: Mother Baby Anesthesia Type: Epidural Level of consciousness: awake and alert and oriented Pain management: pain level controlled Vital Signs Assessment: post-procedure vital signs reviewed and stable Respiratory status: respiratory function stable Cardiovascular status: stable and blood pressure returned to baseline Postop Assessment: no backache, no headache, epidural receding, patient able to bend at knees, no apparent nausea or vomiting and adequate PO intake Anesthetic complications: no     Last Vitals:  Vitals:   07/16/17 1936 07/17/17 0000  BP: 103/78 127/71  Pulse: 84 84  Resp: 18 18  Temp: 36.9 C 36.7 C  SpO2: 98%     Last Pain:  Vitals:   07/17/17 0000  TempSrc: Oral  PainSc:                  Clydene PughBeane, Ashly Goethe D

## 2017-07-17 NOTE — Progress Notes (Signed)
Patient discharged home with infant. Discharge instructions, prescriptions and follow up appointment given to and reviewed with patient. Patient verbalized understanding. Patient wheeled out by auxiliary with infant.  

## 2017-07-17 NOTE — Progress Notes (Signed)
PPD #1 VBAC Subjective:  Sitting up in bed, well-appearing. Pain control is adequate with PRN medication. Lochia is less than menses. Voiding without difficulty. Tolerating a regular diet. Ambulating well.  Objective:   Blood pressure 126/75, pulse 82, temperature 97.8 F (36.6 C), temperature source Oral, resp. rate 18, height 5\' 4"  (1.626 m), weight 262 lb (118.8 kg), last menstrual period 11/09/2016, SpO2 100 %.  General: NAD Pulmonary: no increased work of breathing Abdomen: non-distended, non-tender Uterus:  fundus firm at U/1; lochia rubra small Laceration: N/A Extremities: no edema, no erythema, no tenderness, no signs of DVT  Results for orders placed or performed during the hospital encounter of 07/15/17 (from the past 72 hour(s))  Type and screen [facility]     Status: None   Collection Time: 07/15/17 10:53 PM  Result Value Ref Range   ABO/RH(D) A POS    Antibody Screen NEG    Sample Expiration 07/18/2017   CBC     Status: Abnormal   Collection Time: 07/15/17 11:00 PM  Result Value Ref Range   WBC 14.4 (H) 3.6 - 11.0 K/uL   RBC 4.71 3.80 - 5.20 MIL/uL   Hemoglobin 11.4 (L) 12.0 - 16.0 g/dL   HCT 16.134.4 (L) 09.635.0 - 04.547.0 %   MCV 73.1 (L) 80.0 - 100.0 fL   MCH 24.1 (L) 26.0 - 34.0 pg   MCHC 33.0 32.0 - 36.0 g/dL   RDW 40.916.0 (H) 81.111.5 - 91.414.5 %   Platelets 226 150 - 440 K/uL  RPR     Status: None   Collection Time: 07/15/17 11:00 PM  Result Value Ref Range   RPR Ser Ql Non Reactive Non Reactive    Comment: (NOTE) Performed At: [facility] [address] [place], [place] [id number] Jolene SchimkeNagendra Sanjai MD YQ:[id number]:[phone]   Chlamydia/NGC rt PCR ([facility] only)     Status: None   Collection Time: 07/16/17  4:30 AM  Result Value Ref Range   Specimen source GC/Chlam URINE, RANDOM    Chlamydia Tr NOT DETECTED NOT DETECTED   N gonorrhoeae NOT DETECTED NOT DETECTED    Comment: (NOTE) 100  This methodology has not been evaluated in pregnant women or  in 200  patients with a history of hysterectomy. 300 400  This methodology will not be performed on patients less than 1914  years of age.   CBC     Status: Abnormal   Collection Time: 07/17/17  5:51 AM  Result Value Ref Range   WBC 10.7 3.6 - 11.0 K/uL   RBC 3.66 (L) 3.80 - 5.20 MIL/uL   Hemoglobin 9.0 (L) 12.0 - 16.0 g/dL    Comment: RESULT REPEATED AND VERIFIED   HCT 26.8 (L) 35.0 - 47.0 %   MCV 73.5 (L) 80.0 - 100.0 fL   MCH 24.6 (L) 26.0 - 34.0 pg   MCHC 33.5 32.0 - 36.0 g/dL   RDW 52.815.7 (H) 41.311.5 - 24.414.5 %   Platelets 166 150 - 440 K/uL    Assessment:   26 y.o. [id number] postpartum day # 1, recovering well after VBAC, in good condition.  Plan:   1) Acute blood loss anemia - hemodynamically stable and asymptomatic - PO ferrous sulfate  2) Blood Type --/--/A POS (12/01 2253) /   3) Rubella <0.90 (05/23 0936) ,offer vaccine before discharge / Varicella immune /  TDAP status: received 05/10/2017.  4) Formula feeding  5) Contraception - Plans BTL  6) Disposition - discharge home this afternoon when  baby is ready for discharge.  Marcelyn BruinsJacelyn Morgane Joerger, CNM 07/17/2017  9:12 AM

## 2017-07-18 ENCOUNTER — Other Ambulatory Visit: Payer: Medicaid Other

## 2017-07-18 ENCOUNTER — Encounter: Payer: Medicaid Other | Admitting: Advanced Practice Midwife

## 2017-07-18 NOTE — Anesthesia Preprocedure Evaluation (Signed)
Anesthesia Evaluation  Patient identified by MRN, date of birth, ID band Patient awake    Reviewed: Allergy & Precautions, H&P , NPO status , Patient's Chart, lab work & pertinent test results, reviewed documented beta blocker date and time   Airway Mallampati: III  TM Distance: >3 FB Neck ROM: full    Dental no notable dental hx.    Pulmonary asthma ,           Cardiovascular Exercise Tolerance: Good negative cardio ROS       Neuro/Psych negative neurological ROS  negative psych ROS   GI/Hepatic Neg liver ROS, GERD  ,  Endo/Other  negative endocrine ROS  Renal/GU negative Renal ROS  negative genitourinary   Musculoskeletal   Abdominal   Peds  Hematology negative hematology ROS (+)   Anesthesia Other Findings Past Medical History: No date: Asthma No date: BMI 40.0-44.9, adult (HCC) No date: History of oligohydramnios No date: Medical history non-contributory No date: Recurrent UTI   Reproductive/Obstetrics (+) Pregnancy                             Anesthesia Physical Anesthesia Plan  ASA: II  Anesthesia Plan: Epidural   Post-op Pain Management:    Induction:   PONV Risk Score and Plan:   Airway Management Planned:   Additional Equipment:   Intra-op Plan:   Post-operative Plan:   Informed Consent: I have reviewed the patients History and Physical, chart, labs and discussed the procedure including the risks, benefits and alternatives for the proposed anesthesia with the patient or authorized representative who has indicated his/her understanding and acceptance.     Plan Discussed with: Anesthesiologist, CRNA and Surgeon  Anesthesia Plan Comments:         Anesthesia Quick Evaluation

## 2017-07-31 ENCOUNTER — Telehealth: Payer: Self-pay | Admitting: Obstetrics and Gynecology

## 2017-07-31 ENCOUNTER — Encounter: Payer: Self-pay | Admitting: Obstetrics and Gynecology

## 2017-07-31 ENCOUNTER — Ambulatory Visit (INDEPENDENT_AMBULATORY_CARE_PROVIDER_SITE_OTHER): Payer: Medicaid Other | Admitting: Obstetrics and Gynecology

## 2017-07-31 VITALS — BP 122/74 | Ht 64.0 in | Wt 252.0 lb

## 2017-07-31 DIAGNOSIS — Z309 Encounter for contraceptive management, unspecified: Secondary | ICD-10-CM

## 2017-07-31 DIAGNOSIS — Z302 Encounter for sterilization: Secondary | ICD-10-CM

## 2017-07-31 NOTE — Progress Notes (Signed)
Obstetrics & Gynecology Office Visit   Chief Complaint  Patient presents with  . Follow-up    BTL discussion    History of Present Illness: 26 y.o. [id number] female who is 2 weeks postpartum from a successful VBAC delivery.  She desires permanent sterilization.  She presents for pre-operative counseling for this.      Past Medical History:  Diagnosis Date  . Asthma   . BMI 40.0-44.9, adult (HCC)   . History of oligohydramnios   . Medical history non-contributory   . Recurrent UTI     Past Surgical History:  Procedure Laterality Date  . CESAREAN SECTION    . HAND SURGERY      Gynecologic History: No LMP recorded.  Obstetric History: [id number]  Family History  Problem Relation Age of Onset  . Hypertension Mother     Social History   Socioeconomic History  . Marital status: Single    Spouse name: Not on file  . Number of children: Not on file  . Years of education: Not on file  . Highest education level: Not on file  Social Needs  . Financial resource strain: Not on file  . Food insecurity - worry: Not on file  . Food insecurity - inability: Not on file  . Transportation needs - medical: Not on file  . Transportation needs - non-medical: Not on file  Occupational History  . Not on file  Tobacco Use  . Smoking status: Never Smoker  . Smokeless tobacco: Never Used  Substance and Sexual Activity  . Alcohol use: No  . Drug use: No  . Sexual activity: Yes    Birth control/protection: Surgical    Comment: BTL-No Consent   Other Topics Concern  . Not on file  Social History Narrative  . Not on file    No Known Allergies  Prior to Admission medications   Medication Sig Start Date End Date Taking? Authorizing Provider  ferrous sulfate 325 (65 FE) MG tablet Take 1 tablet (325 mg total) by mouth 2 (two) times daily with a meal. 07/17/17   Oswaldo ConroySchmid, Jacelyn Y, CNM    Review of Systems  Constitutional: Negative.   HENT: Negative.   Eyes: Negative.     Respiratory: Negative.   Cardiovascular: Negative.   Gastrointestinal: Negative.   Genitourinary: Negative.   Musculoskeletal: Negative.   Skin: Negative.   Neurological: Negative.   Psychiatric/Behavioral: Negative.      Physical Exam BP 122/74   Ht 5\' 4"  (1.626 m)   Wt 252 lb (114.3 kg)   BMI 43.26 kg/m  No LMP recorded. Physical Exam  Constitutional: She is oriented to person, place, and time. She appears well-developed and well-nourished. No distress.  Eyes: EOM are normal. No scleral icterus.  Neck: Normal range of motion. Neck supple.  Cardiovascular: Normal rate and regular rhythm.  Pulmonary/Chest: Effort normal and breath sounds normal. No respiratory distress. She has no wheezes. She has no rales.  Abdominal: Soft. Bowel sounds are normal. She exhibits no distension. There is no tenderness. There is no rebound and no guarding.  Musculoskeletal: Normal range of motion. She exhibits no edema.  Neurological: She is alert and oriented to person, place, and time. No cranial nerve deficit.  Skin: Skin is warm and dry. No erythema.  Psychiatric: She has a normal mood and affect. Her behavior is normal. Judgment normal.    Assessment: 26 y.o. [id number] female here for  1. Request for sterilization      Plan: Problem List  Items Addressed This Visit    None    Visit Diagnoses    Request for sterilization    -  Primary    26 y.o. [phone]  with undesired fertility, desires permanent sterilization.  Other reversible forms of contraception were discussed with patient; she declines all other modalities. Permanent nature of as well as associated risks of the procedure discussed with patient including but not limited to: risk of regret, permanence of method, bleeding, infection, injury to surrounding organs and need for additional procedures.  Failure risk of 0.5-1% with increased risk of ectopic gestation if pregnancy occurs was also discussed with patient.    15 minutes spent in  face to face discussion with > 50% spent in counseling, management, and coordination of care of her sterilization.   Thomasene MohairStephen Melanni Benway, MD 07/31/2017 11:29 AM

## 2017-07-31 NOTE — Telephone Encounter (Signed)
-----   Message from Conard NovakStephen D Jackson, MD sent at 07/31/2017 11:30 AM EST ----- Regarding: Schedule Surgery Would you mind scheduling her 1 week post-op appt with 6 week postpartum at the same time?  Surgery Booking Request Patient Full Name:  Rachel Clayton  MRN: [id number]  DOB: [birth year]  Surgeon: Thomasene MohairStephen Jackson, MD  Requested Surgery Date and Time: early January Primary Diagnosis AND Code: desires sterilization, permanent Secondary Diagnosis and Code:  Surgical Procedure: laparoscopic bilateral tubal ligation L&D Notification: No Admission Status: same day surgery Length of Surgery: 30 minutes Special Case Needs: Fishie Clips H&P: Done (date) Phone Interview???: should be fine to do Interpreter: Language:  Medical Clearance: none Special Scheduling Instructions: Patient already consented

## 2017-07-31 NOTE — Telephone Encounter (Signed)
Patient is aware of OR on 08/22/17, and 6 WK CHECK/POST OP on 08/29/17 @ 9:30am. Patient is aware of Pre-admit Testing phone interview to be scheduled.

## 2017-08-15 DIAGNOSIS — Z9289 Personal history of other medical treatment: Secondary | ICD-10-CM

## 2017-08-15 HISTORY — DX: Personal history of other medical treatment: Z92.89

## 2017-08-16 ENCOUNTER — Telehealth: Payer: Self-pay

## 2017-08-16 NOTE — Telephone Encounter (Signed)
Pt has decided against tubal ligation surgery and interested in paragard copper IUD and has some questions. Pt's tubal scheduled for 08/22/17. Pt cb# [phone] thank you.

## 2017-08-18 NOTE — Telephone Encounter (Signed)
Trying to call pt, please let me know when she calls me back about questions for IUD

## 2017-08-21 NOTE — Telephone Encounter (Signed)
Just making sure you knew she wanted to cancel her appt. Was not sure if you had talked to pt

## 2017-08-21 NOTE — Telephone Encounter (Signed)
Please let me know when pt calls me back  

## 2017-08-22 ENCOUNTER — Encounter: Admission: RE | Payer: Self-pay | Source: Ambulatory Visit

## 2017-08-22 ENCOUNTER — Ambulatory Visit
Admission: RE | Admit: 2017-08-22 | Payer: Medicaid Other | Source: Ambulatory Visit | Admitting: Obstetrics and Gynecology

## 2017-08-22 SURGERY — LIGATION, FALLOPIAN TUBE, LAPAROSCOPIC
Anesthesia: Choice | Laterality: Bilateral

## 2017-08-29 ENCOUNTER — Ambulatory Visit (INDEPENDENT_AMBULATORY_CARE_PROVIDER_SITE_OTHER): Payer: Medicaid Other | Admitting: Obstetrics and Gynecology

## 2017-08-29 ENCOUNTER — Encounter: Payer: Self-pay | Admitting: Obstetrics and Gynecology

## 2017-08-29 NOTE — Progress Notes (Signed)
Postpartum Visit   Chief Complaint  Patient presents with  . 6 week post partum   History of Present Illness: Patient is a 27 y.o. [id number] presents for postpartum visit.  Date of delivery: 07/16/2017 Type of delivery: Vaginal delivery  Episiotomy No.  Laceration: no Pregnancy or labor problems:  no Any problems since the delivery:  no  Newborn Details:  SINGLETON :  1. Baby's name: Alliana. Birth weight: 7.2lb Maternal Details:  Breast Feeding:  no Post partum depression/anxiety noted:  no Edinburgh Post-Partum Depression Score:  0  Date of last PAP: 12/14/2016-NORMAL  Past Medical History:  Diagnosis Date  . Asthma   . BMI 40.0-44.9, adult (HCC)   . History of oligohydramnios   . Medical history non-contributory   . Recurrent UTI     Past Surgical History:  Procedure Laterality Date  . CESAREAN SECTION    . HAND SURGERY      Prior to Admission medications   Medication Sig Start Date End Date Taking? Authorizing Provider  ferrous sulfate 325 (65 FE) MG tablet Take 1 tablet (325 mg total) by mouth 2 (two) times daily with a meal. 07/17/17   Oswaldo Conroy, CNM    No Known Allergies   Social History   Socioeconomic History  . Marital status: Single    Spouse name: Not on file  . Number of children: Not on file  . Years of education: Not on file  . Highest education level: Not on file  Social Needs  . Financial resource strain: Not on file  . Food insecurity - worry: Not on file  . Food insecurity - inability: Not on file  . Transportation needs - medical: Not on file  . Transportation needs - non-medical: Not on file  Occupational History  . Not on file  Tobacco Use  . Smoking status: Never Smoker  . Smokeless tobacco: Never Used  Substance and Sexual Activity  . Alcohol use: No  . Drug use: No  . Sexual activity: Yes    Birth control/protection: Surgical    Comment: BTL-No Consent   Other Topics Concern  . Not on file  Social History Narrative   . Not on file    Family History  Problem Relation Age of Onset  . Hypertension Mother     Review of Systems  Constitutional: Negative.   HENT: Negative.   Eyes: Negative.   Respiratory: Negative.   Cardiovascular: Negative.   Gastrointestinal: Negative.   Genitourinary: Negative.   Musculoskeletal: Negative.   Skin: Negative.   Neurological: Negative.   Psychiatric/Behavioral: Negative.      Physical Exam BP 128/84   Ht 5\' 4"  (1.626 m)   Wt 258 lb (117 kg)   BMI 44.29 kg/m   Physical Exam  Constitutional: She is oriented to person, place, and time. She appears well-developed and well-nourished. No distress.  Genitourinary: Vagina normal and uterus normal. Pelvic exam was performed with patient supine. There is no rash, tenderness or lesion on the right labia. There is no rash, tenderness or lesion on the left labia. Right adnexum does not display mass, does not display tenderness and does not display fullness. Left adnexum does not display mass, does not display tenderness and does not display fullness. Cervix does not exhibit motion tenderness, lesion or polyp.  Genitourinary Comments: Exam limited by body habitus  HENT:  Head: Normocephalic and atraumatic.  Eyes: EOM are normal. No scleral icterus.  Neck: Normal range of motion. Neck supple.  Cardiovascular:  Normal rate and regular rhythm.  Pulmonary/Chest: Effort normal and breath sounds normal. No respiratory distress. She has no wheezes. She has no rales.  Abdominal: Soft. Bowel sounds are normal. She exhibits no distension and no mass. There is no tenderness. There is no rebound and no guarding.  Musculoskeletal: Normal range of motion. She exhibits no edema.  Neurological: She is alert and oriented to person, place, and time. No cranial nerve deficit.  Skin: Skin is warm and dry. No erythema.  Psychiatric: She has a normal mood and affect. Her behavior is normal. Judgment normal.    Female Chaperone present during  breast and/or pelvic exam.  Assessment: 27 y.o. [id number] presenting for 6 week postpartum visit  Plan: Problem List Items Addressed This Visit    None    Visit Diagnoses    Postpartum care and examination    -  Primary      1) Contraception Options: wants BTL. Will call to schedule when ready (waiting for her mother's surgery to be completed)  2)  Pap - ASCCP guidelines and rational discussed.  Patient opts for routine screening interval  3) Patient underwent screening for postpartum depression with no concerns noted.  4) Follow up 1 year for routine annual exam  Thomasene MohairStephen Rohaan Durnil, MD 08/29/2017 3:23 PM

## 2017-09-13 ENCOUNTER — Other Ambulatory Visit: Payer: Medicaid Other

## 2018-07-22 ENCOUNTER — Observation Stay
Admission: EM | Admit: 2018-07-22 | Discharge: 2018-07-23 | Disposition: A | Discharge status: Home / Self Care | Payer: Self-pay | Attending: Obstetrics and Gynecology | Admitting: Obstetrics and Gynecology

## 2018-07-22 ENCOUNTER — Encounter: Payer: Self-pay | Admitting: Emergency Medicine

## 2018-07-22 ENCOUNTER — Emergency Department: Payer: Self-pay

## 2018-07-22 ENCOUNTER — Other Ambulatory Visit: Payer: Self-pay

## 2018-07-22 DIAGNOSIS — N920 Excessive and frequent menstruation with regular cycle: Secondary | ICD-10-CM | POA: Insufficient documentation

## 2018-07-22 DIAGNOSIS — N938 Other specified abnormal uterine and vaginal bleeding: Secondary | ICD-10-CM

## 2018-07-22 DIAGNOSIS — D62 Acute posthemorrhagic anemia: Secondary | ICD-10-CM

## 2018-07-22 DIAGNOSIS — N939 Abnormal uterine and vaginal bleeding, unspecified: Secondary | ICD-10-CM

## 2018-07-22 DIAGNOSIS — N921 Excessive and frequent menstruation with irregular cycle: Secondary | ICD-10-CM | POA: Diagnosis present

## 2018-07-22 LAB — URINALYSIS, COMPLETE (UACMP) WITH MICROSCOPIC
Bacteria, UA: NONE SEEN
Specific Gravity, Urine: 1.02 (ref 1.005–1.030)

## 2018-07-22 LAB — BASIC METABOLIC PANEL
ANION GAP: 8 (ref 5–15)
BUN: 8 mg/dL (ref 6–20)
CO2: 22 mmol/L (ref 22–32)
Calcium: 8.9 mg/dL (ref 8.9–10.3)
Chloride: 107 mmol/L (ref 98–111)
Creatinine, Ser: 0.55 mg/dL (ref 0.44–1.00)
GFR calc Af Amer: >60 mL/min (ref >60)
GFR calc non Af Amer: >60 mL/min (ref >60)
GLUCOSE: 92 mg/dL (ref 70–99)
POTASSIUM: 4 mmol/L (ref 3.5–5.1)
Sodium: 137 mmol/L (ref 135–145)

## 2018-07-22 LAB — CBC
HCT: 20.8 % — ABNORMAL LOW (ref 36.0–46.0)
HEMOGLOBIN: 5.9 g/dL — AB (ref 12.0–15.0)
MCH: 19.3 pg — AB (ref 26.0–34.0)
MCHC: 28.4 g/dL — ABNORMAL LOW (ref 30.0–36.0)
MCV: 68.2 fL — AB (ref 80.0–100.0)
Platelets: 257 10*3/uL (ref 150–400)
RBC: 3.05 MIL/uL — AB (ref 3.87–5.11)
RDW: 16 % — ABNORMAL HIGH (ref 11.5–15.5)
WBC: 5.5 10*3/uL (ref 4.0–10.5)
nRBC: 0.4 % — ABNORMAL HIGH (ref 0.0–0.2)

## 2018-07-22 LAB — POCT PREGNANCY, URINE: Preg Test, Ur: NEGATIVE

## 2018-07-22 LAB — PREPARE RBC (CROSSMATCH)

## 2018-07-22 MED ORDER — LACTATED RINGERS IV SOLN
125.0000 mL/h | INTRAVENOUS | Status: DC
  Administered 2018-07-22 – 2018-07-23 (×3): 125 mL/h via INTRAVENOUS

## 2018-07-22 MED ORDER — SODIUM CHLORIDE 0.9 % IV SOLN: 10.0000 mL/h | Freq: Once | INTRAVENOUS | Status: DC

## 2018-07-22 MED ORDER — SODIUM CHLORIDE 0.9 % IV BOLUS
1000.0000 mL | Freq: Once | INTRAVENOUS | Status: AC
  Administered 2018-07-22: 1000 mL via INTRAVENOUS

## 2018-07-22 MED ORDER — MEDROXYPROGESTERONE ACETATE 10 MG PO TABS
10.0000 mg | ORAL_TABLET | Freq: Two times a day (BID) | ORAL | Status: DC
  Administered 2018-07-22 – 2018-07-23 (×2): 10 mg via ORAL
  Filled 2018-07-22 (×3): qty 1

## 2018-07-22 NOTE — ED Notes (Signed)
Report to mother baby

## 2018-07-22 NOTE — ED Notes (Signed)
First unit of blood infused. vss.

## 2018-07-22 NOTE — H&P (Signed)
GYNECOLOGY ADMISSION HISTORY AND PHYSICAL NOTE    Attending Provider: Conard NovakJackson, Adaleena Mooers D, MD   Rachel Clayton [id number] 07/22/2018 7:11 PM    Chief Complaint:   Rachel Clayton is a 27 y.o. [phone] premenopausal female seen at the request of Dr. Dionne BucySebastian Siadecki for evaluation of heavy vaginal bleeding and severe anemia as a result of acute b lood loss.     History of Present Ilness:   The patient is one year post-partum from a successful VBAC. She did not breast feed.  She initially had normal menses, coming monthly, lasting 7 days, without clots. Then, she began having some missed months of menses.  When her menses would come they would be normal, as described above.  About six weeks ago she began having heavy menses that would have clots with some cramping. It really hasn't stopped according to her. She presented to the ER due to having a headache and due to abdominal cramps. She does not increase in labored breathing with less exertion.  She has felt increasing fatigue. She notes no changes in her medical status.  No new surgeries and medications or medical problems. She denies fevers, chills, rectal bleeding, or obvious bleeding from any other source.  She has tried to medication or treatment for these symptoms.  Her bleeding now is actually stopped.     Past Medical History:  Diagnosis Date  . Asthma   . BMI 40.0-44.9, adult (HCC)   . Recurrent UTI    Past Surgical History:  Procedure Laterality Date  . CESAREAN SECTION    . HAND SURGERY     Allergies: No Known Allergies   Prior to Admission medications   none   Obstetric History: She is a [id number] female  Social History:  She  reports that she has never smoked. She has never used smokeless tobacco. She reports that she does not drink alcohol or use drugs.  Family History:  family history includes Hypertension in her mother.   Review of Systems:   Review of Systems  Constitutional: Positive for malaise/fatigue. Negative for  chills, diaphoresis, fever and weight loss.  HENT: Negative.   Eyes: Negative.   Respiratory: Negative.   Cardiovascular: Negative for chest pain, palpitations, orthopnea, claudication, leg swelling and PND.  Gastrointestinal: Positive for abdominal pain (abdominal cramping, see HPI). Negative for blood in stool, constipation, diarrhea, heartburn, melena, nausea and vomiting.  Genitourinary: Negative.   Musculoskeletal: Negative.   Skin: Negative.   Neurological: Negative.   Endo/Heme/Allergies: Negative.   Psychiatric/Behavioral: Negative.      Objective    BP 117/61   Pulse 86   Temp 98.1 F (36.7 C)   Resp 18   Ht 5\' 4"  (1.626 m)   Wt 111.1 kg   SpO2 100%   BMI 42.05 kg/m  Physical Exam  Physical Exam  Constitutional: She is oriented to person, place, and time. She appears well-developed and well-nourished. No distress.  HENT:  Head: Normocephalic and atraumatic.  Eyes: Conjunctivae are normal. No scleral icterus.  Neck: Normal range of motion. Neck supple.  Cardiovascular: Normal rate and regular rhythm. Exam reveals no gallop and no friction rub.  Murmur (II/VI SEM) heard. Pulmonary/Chest: Effort normal and breath sounds normal. No respiratory distress. She has no wheezes. She has no rales.  Abdominal: Soft. Bowel sounds are normal. She exhibits no distension and no mass. There is tenderness (mild ttp suprapubic region). There is no rebound and no guarding.  Musculoskeletal: Normal range of motion. She  exhibits no edema.  Neurological: She is alert and oriented to person, place, and time. No cranial nerve deficit.  Skin: Skin is warm and dry. No erythema.  Psychiatric: She has a normal mood and affect. Her behavior is normal. Judgment normal.   Laboratory Results:   Lab Results  Component Value Date   WBC 5.5 07/22/2018   RBC 3.05 (L) 07/22/2018   HGB 5.9 (L) 07/22/2018   HCT 20.8 (L) 07/22/2018   PLT 257 07/22/2018   NA 137 07/22/2018   K 4.0 07/22/2018    CREATININE 0.55 07/22/2018   Lab Results  Component Value Date   PREGTESTUR NEGATIVE 07/22/2018   Imaging Results:  US Pelvis (transabdominal Only)  Result Date: 07/22/2018 CLINICAL DATA:  Vaginal bleeding. EXAM: TRANSABDOMINAL ULTRASOUND OF PELVIS TECHNIQUE: Transabdominal ultrasound examination of the pelvis was performed including evaluation of the uterus, ovaries, adnexal regions, and pelvic cul-de-sac. COMPARISON:  None. FINDINGS: Uterus Measurements: 11.8 x 4.2 x 5.9 cm = volume: 153 mL. No fibroids or other mass visualized. Endometrium Thickness: 4 mm.  No focal abnormality visualized. Right ovary Measurements: 2.8 x 3.2 x 3.5 cm = volume: 17 mL. Normal appearance/no adnexal mass. Left ovary Measurements: 4.3 x 2.8 x 3.3 cm = volume: 21 mL. Normal appearance/no adnexal mass. Other findings:  No abnormal free fluid. IMPRESSION: Normal transabdominal pelvic ultrasound. Electronically Signed   By: Kennith Center M.D.   On: 07/22/2018 18:19      Assessment & Plan   Rachel Clayton is a 27 y.o. [id number] premenopausal female with acute blood loss anemia due to heavy menstrual bleeding.    Plan:  1.  She is status post 1 unit pRBCs. She has another ordered. She is to receive the second unit Will check a CBC 6 hours post-transfusion. 2.  Provera for endometrial stability. Will need to develop longer-term plan for her bleeding profile, which sounds anovulatory. Though, will get a GC/Chlamydia (by urine), which wasn't obtained in the ER.   3.  Expect discharge in the AM.  Thomasene Mohair, MD 07/22/2018 7:11 PM

## 2018-07-22 NOTE — ED Provider Notes (Signed)
[facility] Emergency Department Provider Note ____________________________________________   First MD Initiated Contact with Patient 07/22/18 1454     (approximate)  I have reviewed the triage vital signs and the nursing notes.   HISTORY  Chief Complaint Vaginal Bleeding and Dizziness    HPI Rachel Clayton is a 27 y.o. female with PMH as noted below who presents with vaginal bleeding over the last 2 months, described as heavy, persistent course, and worsening in the last 1 to 2 weeks.  She reports some crampy intermittent abdominal pain.  The patient describes the bleeding as heavy with her having to change pads every few hours.  She reports associated weakness, feeling shaky, and lightheadedness.  She also is also short of breath when she walks.  She denies any vomiting or fever.  The patient has had heavy and irregular periods in the past but nothing this long, and has not required a blood transfusion in the past.    Past Medical History:  Diagnosis Date  . Asthma   . BMI 40.0-44.9, adult (HCC)   . History of oligohydramnios   . Medical history non-contributory   . Recurrent UTI     Patient Active Problem List   Diagnosis Date Noted  . Acute blood loss anemia 07/22/2018  . Menorrhagia with irregular cycle 07/22/2018  . Anemia associated with acute blood loss 07/22/2018    Past Surgical History:  Procedure Laterality Date  . CESAREAN SECTION    . HAND SURGERY      Prior to Admission medications   Not on File    Allergies Patient has no known allergies.  Family History  Problem Relation Age of Onset  . Hypertension Mother     Social History Social History   Tobacco Use  . Smoking status: Never Smoker  . Smokeless tobacco: Never Used  Substance Use Topics  . Alcohol use: No  . Drug use: No    Review of Systems  Constitutional: No fever. Eyes: No redness. ENT: No sore throat. Cardiovascular: Denies chest pain. Respiratory:  Positive for shortness of breath. Gastrointestinal: No vomiting. Genitourinary: Positive for vaginal bleeding. Musculoskeletal: Negative for back pain. Skin: Negative for rash. Neurological: Negative for headache.   ____________________________________________   PHYSICAL EXAM:  VITAL SIGNS: ED Triage Vitals  Enc Vitals Group     BP 07/22/18 1253 (!) 134/45     Pulse Rate 07/22/18 1253 95     Resp 07/22/18 1253 20     Temp 07/22/18 1253 98.2 F (36.8 C)     Temp Source 07/22/18 1253 Oral     SpO2 07/22/18 1253 100 %     Weight 07/22/18 1254 245 lb (111.1 kg)     Height 07/22/18 1254 5\' 4"  (1.626 m)     Head Circumference --      Peak Flow --      Pain Score 07/22/18 1254 5     Pain Loc --      Pain Edu? --      Excl. in GC? --     Constitutional: Alert and oriented.  Weak appearing but in no acute distress. Eyes: Conjunctivae are pale. Head: Atraumatic. Nose: No congestion/rhinnorhea. Mouth/Throat: Mucous membranes are moist.   Neck: Normal range of motion.  Cardiovascular: Good peripheral circulation. Respiratory: Normal respiratory effort.  No retractions.  Gastrointestinal: Soft and nontender. No distention.  Genitourinary: Small amount of blood pooling in the vaginal vault with no active hemorrhage.  No tenderness or discharge.  Musculoskeletal: Extremities warm and well perfused.  Neurologic:  Normal speech and language. No gross focal neurologic deficits are appreciated.  Skin:  Skin is warm and dry. No rash noted. Psychiatric: Mood and affect are normal. Speech and behavior are normal.  ____________________________________________   LABS (all labs ordered are listed, but only abnormal results are displayed)  Labs Reviewed  CBC - Abnormal; Notable for the following components:      Result Value   RBC 3.05 (*)    Hemoglobin 5.9 (*)    HCT 20.8 (*)    MCV 68.2 (*)    MCH 19.3 (*)    MCHC 28.4 (*)    RDW 16.0 (*)    nRBC 0.4 (*)    All other components  within normal limits  URINALYSIS, COMPLETE (UACMP) WITH MICROSCOPIC - Abnormal; Notable for the following components:   Color, Urine RED (*)    APPearance CLOUDY (*)    Glucose, UA   (*)    Value: TEST NOT REPORTED DUE TO COLOR INTERFERENCE OF URINE PIGMENT   Hgb urine dipstick   (*)    Value: TEST NOT REPORTED DUE TO COLOR INTERFERENCE OF URINE PIGMENT   Bilirubin Urine   (*)    Value: TEST NOT REPORTED DUE TO COLOR INTERFERENCE OF URINE PIGMENT   Ketones, ur   (*)    Value: TEST NOT REPORTED DUE TO COLOR INTERFERENCE OF URINE PIGMENT   Protein, ur   (*)    Value: TEST NOT REPORTED DUE TO COLOR INTERFERENCE OF URINE PIGMENT   Nitrite   (*)    Value: TEST NOT REPORTED DUE TO COLOR INTERFERENCE OF URINE PIGMENT   Leukocytes, UA   (*)    Value: TEST NOT REPORTED DUE TO COLOR INTERFERENCE OF URINE PIGMENT   RBC / HPF >50 (*)    All other components within normal limits  BASIC METABOLIC PANEL  HIV ANTIBODY (ROUTINE TESTING W REFLEX)  POC URINE PREG, ED  POCT PREGNANCY, URINE  TYPE AND SCREEN  PREPARE RBC (CROSSMATCH)   ____________________________________________  EKG  ED ECG REPORT I, Dionne BucySebastian Corryn Madewell, the attending physician, personally viewed and interpreted this ECG.  Date: 07/22/2018 EKG Time: 1301 Rate: 87 Rhythm: normal sinus rhythm QRS Axis: Right axis Intervals: normal ST/T Wave abnormalities: normal Narrative Interpretation: no evidence of acute ischemia ____________________________________________  RADIOLOGY  US pelvis: No acute abnormalities  ____________________________________________   PROCEDURES  Procedure(s) performed: No  Procedures  Critical Care performed: No ____________________________________________   INITIAL IMPRESSION / ASSESSMENT AND PLAN / ED COURSE  Pertinent labs & imaging results that were available during my care of the patient were reviewed by me and considered in my medical decision making (see chart for  details).  27 year old female with PMH as noted above presents with heavy vaginal bleeding over the last few months, worsening in the last week.  The patient states that she has associated weakness, lightheadedness, and some exertional shortness of breath.  On exam the vital signs are normal and the patient is relatively comfortable appearing.  Her abdomen is soft and nontender.  Initial labs obtained from triage show significant anemia compared to the patient's baseline.  Given her blood loss and symptoms, she will require transfusion.  ----------------------------------------- 6:53 PM on 07/22/2018 -----------------------------------------  I consulted Dr. Jean RosenthalJackson from OB/GYN who suggested obtaining pelvic ultrasound.  The ultrasound shows no acute abnormalities.  I discussed the case further with him.  We will admit to the OB/GYN service for transfusion and  monitoring. ____________________________________________   FINAL CLINICAL IMPRESSION(S) / ED DIAGNOSES  Final diagnoses:  Vaginal bleeding      NEW MEDICATIONS STARTED DURING THIS VISIT:  New Prescriptions   No medications on file     Note:  This document was prepared using Dragon voice recognition software and may include unintentional dictation errors.    Dionne Bucy, MD 07/22/18 [phone]

## 2018-07-22 NOTE — ED Triage Notes (Signed)
Patient reports heavy vaginal bleeding with clots for the past 2 months. Reports history of irregular periods but denies any similar episodes. Patient also reports abdominal cramping as well as dizziness and fatigue. Patient states "I'm super shaky and am having sweating spells." Patient appears pale in triage.

## 2018-07-23 LAB — CBC
HCT: 25.1 % — ABNORMAL LOW (ref 36.0–46.0)
Hemoglobin: 7.7 g/dL — ABNORMAL LOW (ref 12.0–15.0)
MCH: 21.8 pg — ABNORMAL LOW (ref 26.0–34.0)
MCHC: 30.7 g/dL (ref 30.0–36.0)
MCV: 71.1 fL — ABNORMAL LOW (ref 80.0–100.0)
Platelets: 272 10*3/uL (ref 150–400)
RBC: 3.53 MIL/uL — ABNORMAL LOW (ref 3.87–5.11)
RDW: 18.3 % — ABNORMAL HIGH (ref 11.5–15.5)
WBC: 6.6 10*3/uL (ref 4.0–10.5)
nRBC: 0.5 % — ABNORMAL HIGH (ref 0.0–0.2)

## 2018-07-23 LAB — CHLAMYDIA/NGC RT PCR (ARMC ONLY)
Chlamydia Tr: NOT DETECTED
N GONORRHOEAE: NOT DETECTED

## 2018-07-23 LAB — BPAM RBC
Blood Product Expiration Date: 201912242359
Blood Product Expiration Date: 201912242359
ISSUE DATE / TIME: 201912081645
ISSUE DATE / TIME: 201912082158
Unit Type and Rh: 6200
Unit Type and Rh: 6200

## 2018-07-23 LAB — TYPE AND SCREEN
ABO/RH(D): A POS
Antibody Screen: NEGATIVE
UNIT DIVISION: 0
UNIT DIVISION: 0

## 2018-07-23 MED ORDER — FERROUS SULFATE 325 (65 FE) MG PO TABS: 325.0000 mg | ORAL_TABLET | Freq: Two times a day (BID) | ORAL | Status: DC

## 2018-07-23 MED ORDER — ACETAMINOPHEN 500 MG PO TABS: 1000.0000 mg | ORAL_TABLET | Freq: Four times a day (QID) | ORAL | Status: DC | PRN

## 2018-07-23 MED ORDER — MEDROXYPROGESTERONE ACETATE 10 MG PO TABS: 10.0000 mg | ORAL_TABLET | Freq: Two times a day (BID) | ORAL | 1 refills | Status: DC

## 2018-07-23 MED ORDER — FERROUS SULFATE 325 (65 FE) MG PO TABS: 325.0000 mg | ORAL_TABLET | Freq: Two times a day (BID) | ORAL | 1 refills | Status: DC

## 2018-07-23 MED ORDER — DOCUSATE SODIUM 100 MG PO CAPS: 100.0000 mg | ORAL_CAPSULE | Freq: Two times a day (BID) | ORAL | 2 refills | Status: DC | PRN

## 2018-07-23 NOTE — Discharge Instructions (Signed)
Abnormal Uterine Bleeding Abnormal uterine bleeding can affect women at various stages in life, including teenagers, women in their reproductive years, pregnant women, and women who have reached menopause. Several kinds of uterine bleeding are considered abnormal, including:  Bleeding or spotting between periods.  Bleeding after sexual intercourse.  Bleeding that is heavier or more than normal.  Periods that last longer than usual.  Bleeding after menopause. Many cases of abnormal uterine bleeding are minor and simple to treat, while others are more serious. Any type of abnormal bleeding should be evaluated by your health care provider. Treatment will depend on the cause of the bleeding. Follow these instructions at home: Monitor your condition for any changes. The following actions may help to alleviate any discomfort you are experiencing:  Avoid the use of tampons and douches as directed by your health care provider.  Change your pads frequently. You should get regular pelvic exams and Pap tests. Keep all follow-up appointments for diagnostic tests as directed by your health care provider. Contact a health care provider if:  Your bleeding lasts more than 1 week.  You feel dizzy at times. Get help right away if:  You pass out.  You are changing pads every 15 to 30 minutes.  You have abdominal pain.  You have a fever.  You become sweaty or weak.  You are passing large blood clots from the vagina.  You start to feel nauseous and vomit. This information is not intended to replace advice given to you by your health care provider. Make sure you discuss any questions you have with your health care provider. Document Released: 08/01/2005 Document Revised: 01/13/2016 Document Reviewed: 02/28/2013 Elsevier Interactive Patient Education  2017 Elsevier Inc.  

## 2018-07-23 NOTE — Progress Notes (Signed)
Discharge order received from doctor. Reviewed discharge instructions and prescriptions with patient and answered all questions. Follow up appointment given. Patient verbalized understanding. Patient discharged home via ambulatory (per patient request) by nursing/auxillary.     Oswald HillockAbigail Garner, RN

## 2018-07-23 NOTE — Final Progress Note (Signed)
Physician Final Progress Note  Patient ID: Rachel Clayton MRN: [id number] DOB/AGE: [birth year] 27 y.o.  Admit date: 07/22/2018 Admitting provider: Conard NovakStephen D Jackson, MD Discharge date: 07/23/2018   Admission Diagnoses: Menorrhagia Severe anemia due to menorrhagia/ acute blood loss  Discharge Diagnoses:  Principal Problem:   Acute blood loss anemia Active Problems:   Menorrhagia with irregular cycle     Consults: None  Significant Findings/ Diagnostic Studies: Rachel Clayton is a 27 y.o. [id number] premenopausal female presented to the ER with complaints of heavy vaginal bleeding x6 weeks.worsening over the past 2 weeks and with passage of large clots. Her hemoglobin on arrival was 5.9gm/dl and hematocrit was 65.7%20.8%. She reported associated weakness, feeling shaky, and lightheadedness.  She also was short of breath when she walked. Her menses this year have been infrequent, but as time went on they have begun to last longer and have become heavier.  Pelvic ultrasound was negative for any masses and the endometrium measured 4 mm.  She was admitted for a blood transfusion and was started on Provera 20 mgm daily. After receiving 2 units of blood, her hemoglobin was 7.7gm/dl and her hematocrit was 25.1%. She no longer was lightheaded or short of breath. Her bleeding has decreased and did pass a couple of smaller clots this AM. She was felt to be stable for discharge. She was advised to continue Provera 20 mgm daily and to start iron supplements. Follow up with Dr Jean RosenthalJackson in 7-10 days to discuss long term plans to treat/ prevent menometrorrhagia.  Procedures: none  Discharge Condition: stable  Disposition: Discharge disposition: 01-Home or Self Care       Diet: Regular diet. Discussed foods high in iron  Discharge Activity: Activity as tolerated   Allergies as of 07/23/2018   No Known Allergies     Medication List    TAKE these medications   docusate sodium 100 MG  capsule Commonly known as:  COLACE Take 1 capsule (100 mg total) by mouth 2 (two) times daily as needed for mild constipation.   ferrous sulfate 325 (65 FE) MG tablet Take 1 tablet (325 mg total) by mouth 2 (two) times daily with a meal.   medroxyPROGESTERone 10 MG tablet Commonly known as:  PROVERA Take 1 tablet (10 mg total) by mouth 2 (two) times daily.      Follow-up Information    Conard NovakJackson, Stephen D, MD. Go on 07/23/2018.   Specialty:  Obstetrics and Gynecology Why:  Follow up appointment on Thursday Decmeber 19th at 1:30 PM with Dr. Henderson NewcomerJackson Contact information: [address] [place] [address] [phone]           Total time spent taking care of this patient: 20 minutes  Signed: Farrel Connersolleen Dylen Mcelhannon 07/23/2018, 12:03 PM

## 2018-07-24 LAB — HIV ANTIBODY (ROUTINE TESTING W REFLEX): HIV Screen 4th Generation wRfx: NONREACTIVE

## 2018-07-26 ENCOUNTER — Ambulatory Visit: Payer: Medicaid Other | Admitting: Obstetrics and Gynecology

## 2018-08-02 ENCOUNTER — Ambulatory Visit (INDEPENDENT_AMBULATORY_CARE_PROVIDER_SITE_OTHER): Payer: Self-pay | Admitting: Obstetrics and Gynecology

## 2018-08-02 ENCOUNTER — Encounter: Payer: Self-pay | Admitting: Obstetrics and Gynecology

## 2018-08-02 VITALS — BP 126/74 | Ht 64.0 in | Wt 262.0 lb

## 2018-08-02 DIAGNOSIS — D62 Acute posthemorrhagic anemia: Secondary | ICD-10-CM

## 2018-08-02 DIAGNOSIS — N921 Excessive and frequent menstruation with irregular cycle: Secondary | ICD-10-CM

## 2018-08-02 MED ORDER — NORETHIN ACE-ETH ESTRAD-FE 1-20 MG-MCG(24) PO CAPS: 1.0000 | ORAL_CAPSULE | Freq: Every day | ORAL | 0 refills | Status: DC

## 2018-08-02 NOTE — Progress Notes (Signed)
Obstetrics & Gynecology Office Visit   Chief Complaint  Patient presents with  . Follow-up  Hospitalization for severe anemia requiring blood transfusion  History of Present Illness: 27 y.o. [id number] female who presents in follow up from the above.  [facility]/8 for anemia and was discharged on 12/9. She was discharged on provera 20 mg per day and instructed to start iron supplementation.  Since leaving the hospital she has had no bleeding.  She continues to take the medication daily.  She has a headache today, but not before.    Past Medical History:  Diagnosis Date  . Asthma   . BMI 40.0-44.9, adult (HCC)   . Recurrent UTI     Past Surgical History:  Procedure Laterality Date  . CESAREAN SECTION    . HAND SURGERY     Gynecologic History: No LMP recorded. (Menstrual status: Irregular Periods).  Obstetric History: [id number]  Family History  Problem Relation Age of Onset  . Hypertension Mother     Social History   Socioeconomic History  . Marital status: Single    Spouse name: Not on file  . Number of children: Not on file  . Years of education: Not on file  . Highest education level: Not on file  Occupational History  . Not on file  Social Needs  . Financial resource strain: Not on file  . Food insecurity:    Worry: Not on file    Inability: Not on file  . Transportation needs:    Medical: Not on file    Non-medical: Not on file  Tobacco Use  . Smoking status: Never Smoker  . Smokeless tobacco: Never Used  Substance and Sexual Activity  . Alcohol use: No  . Drug use: No  . Sexual activity: Yes    Birth control/protection: Surgical    Comment: BTL-No Consent   Lifestyle  . Physical activity:    Days per week: Not on file    Minutes per session: Not on file  . Stress: Not on file  Relationships  . Social connections:    Talks on phone: Not on file    Gets together: Not on file    Attends religious service: Not on file    Active  member of club or organization: Not on file    Attends meetings of clubs or organizations: Not on file    Relationship status: Not on file  . Intimate partner violence:    Fear of current or ex partner: Not on file    Emotionally abused: Not on file    Physically abused: Not on file    Forced sexual activity: Not on file  Other Topics Concern  . Not on file  Social History Narrative  . Not on file    No Known Allergies  Prior to Admission medications   Medication Sig Start Date End Date Taking? Authorizing Provider  docusate sodium (COLACE) 100 MG capsule Take 1 capsule (100 mg total) by mouth 2 (two) times daily as needed for mild constipation. 07/23/18 07/23/19 Yes Farrel ConnersGutierrez, Colleen, CNM  ferrous sulfate 325 (65 FE) MG tablet Take 1 tablet (325 mg total) by mouth 2 (two) times daily with a meal. 07/23/18  Yes Farrel ConnersGutierrez, Colleen, CNM  medroxyPROGESTERone (PROVERA) 10 MG tablet Take 1 tablet (10 mg total) by mouth 2 (two) times daily. 07/23/18  Yes Farrel ConnersGutierrez, Colleen, CNM    Review of Systems  Constitutional: Negative.   HENT: Negative.   Eyes: Negative.  Respiratory: Negative.   Cardiovascular: Negative.   Gastrointestinal: Negative.   Genitourinary: Negative.   Musculoskeletal: Negative.   Skin: Negative.   Neurological: Negative for dizziness, tingling, tremors, sensory change, speech change, focal weakness, seizures, loss of consciousness, weakness and headaches.  Psychiatric/Behavioral: Negative.      Physical Exam BP 126/74   Ht 5\' 4"  (1.626 m)   Wt 262 lb (118.8 kg)   BMI 44.97 kg/m  No LMP recorded. (Menstrual status: Irregular Periods). Physical Exam Constitutional:      General: She is not in acute distress.    Appearance: Normal appearance. She is well-developed.  Eyes:     General: No scleral icterus.    Conjunctiva/sclera: Conjunctivae normal.  Musculoskeletal: Normal range of motion.  Neurological:     Mental Status: She is alert and oriented to person,  place, and time.     Cranial Nerves: No cranial nerve deficit.  Skin:    General: Skin is warm and dry.     Findings: No erythema.  Psychiatric:        Mood and Affect: Mood normal.        Behavior: Behavior normal.        Judgment: Judgment normal.     Assessment: 27 y.o. [id number] female here for  1. Menorrhagia with irregular cycle   2. Anemia associated with acute blood loss      Plan: Problem List Items Addressed This Visit      Other   Menorrhagia with irregular cycle - Primary   Relevant Medications   Norethin Ace-Eth Estrad-FE (TAYTULLA) 1-20 MG-MCG(24) CAPS   Anemia associated with acute blood loss   Relevant Medications   Norethin Ace-Eth Estrad-FE (TAYTULLA) 1-20 MG-MCG(24) CAPS     Discussed various forms of minimizing bleeding with her cycles.  Discussed that her cycles may be anovulatory and that may carry its own risks if estrogen is not antagonized with progesterone.  Discussed combined oral contraceptives and Mirena IUD.  She would like to consider.  She was given samples of Taytulla to take while she makes her decision.  She will call when her decision is made.  20 minutes spent in face to face discussion with > 50% spent in counseling,management, and coordination of care of her menorrhagia with irregular cycle and anemia associated with acute blood loss.   Thomasene MohairStephen Brolin Dambrosia, MD 08/02/2018 5:52 PM

## 2018-08-09 ENCOUNTER — Other Ambulatory Visit: Payer: Self-pay | Admitting: Obstetrics and Gynecology

## 2018-08-09 DIAGNOSIS — N921 Excessive and frequent menstruation with irregular cycle: Secondary | ICD-10-CM

## 2018-08-09 DIAGNOSIS — D62 Acute posthemorrhagic anemia: Secondary | ICD-10-CM

## 2018-08-09 MED ORDER — NORGESTIMATE-ETH ESTRADIOL 0.25-35 MG-MCG PO TABS: 1.0000 | ORAL_TABLET | Freq: Three times a day (TID) | ORAL | 0 refills | Status: DC

## 2018-08-13 NOTE — Telephone Encounter (Signed)
Pt states she hasn't gotten the rx SDJ sent in for her b/c it needs to be approved.  [id number]

## 2018-08-13 NOTE — Telephone Encounter (Signed)
Can you call the pharmacy and ask what the hold up is? This is a generic birth control.  So, there should be no issue. She might even be able to pay out of pocket using [employer] or something like that for very cheap.

## 2018-08-14 NOTE — Telephone Encounter (Signed)
Spoke w/pharmacy. They were needing to clarify instructions. Relayed to SDJ who states he responded to fax request yesterday. Spoke w/pharmacy again they stated the rx would be filled.

## 2019-02-17 ENCOUNTER — Emergency Department
Admission: EM | Admit: 2019-02-17 | Discharge: 2019-02-17 | Disposition: A | Discharge status: Home / Self Care | Payer: Medicaid Other | Attending: Emergency Medicine | Admitting: Emergency Medicine

## 2019-02-17 ENCOUNTER — Other Ambulatory Visit: Payer: Self-pay

## 2019-02-17 ENCOUNTER — Encounter: Payer: Self-pay | Admitting: Emergency Medicine

## 2019-02-17 DIAGNOSIS — K047 Periapical abscess without sinus: Secondary | ICD-10-CM

## 2019-02-17 DIAGNOSIS — J45909 Unspecified asthma, uncomplicated: Secondary | ICD-10-CM | POA: Diagnosis not present

## 2019-02-17 DIAGNOSIS — K0889 Other specified disorders of teeth and supporting structures: Secondary | ICD-10-CM | POA: Diagnosis present

## 2019-02-17 MED ORDER — AMOXICILLIN 875 MG PO TABS: 875.0000 mg | ORAL_TABLET | Freq: Two times a day (BID) | ORAL | 0 refills | Status: DC

## 2019-02-17 MED ORDER — TRAMADOL HCL 50 MG PO TABS: 50.0000 mg | ORAL_TABLET | Freq: Four times a day (QID) | ORAL | 0 refills | Status: DC | PRN

## 2019-02-17 NOTE — ED Provider Notes (Signed)
[facility] Emergency Department Provider Note   ____________________________________________   First MD Initiated Contact with Patient 02/17/19 1342     (approximate)  I have reviewed the triage vital signs and the nursing notes.   HISTORY  Chief Complaint Dental Pain    HPI Rachel Clayton is a 28 y.o. female presents to the ED with complaint of increased dental pain.  Patient states that she cracked her tooth 2 months ago but has not made an appointment with a dentist as she did not have Medicaid.  She states that this afternoon she had a sharp pain in this area that has become unbearable.  She rates her pain as a 10/10.       Past Medical History:  Diagnosis Date  . Asthma   . BMI 40.0-44.9, adult (HCC)   . Recurrent UTI     Patient Active Problem List   Diagnosis Date Noted  . Acute blood loss anemia 07/22/2018  . Menorrhagia with irregular cycle 07/22/2018  . Anemia associated with acute blood loss 07/22/2018    Past Surgical History:  Procedure Laterality Date  . CESAREAN SECTION    . HAND SURGERY      Prior to Admission medications   Medication Sig Start Date End Date Taking? Authorizing Provider  amoxicillin (AMOXIL) 875 MG tablet Take 1 tablet (875 mg total) by mouth 2 (two) times daily. 02/17/19   Tommi RumpsSummers, Pietro Bonura L, PA-C  Norethin Ace-Eth Estrad-FE (TAYTULLA) 1-20 MG-MCG(24) CAPS Take 1 capsule by mouth daily. 08/02/18 11/26/18  Conard NovakJackson, Stephen D, MD  norgestimate-ethinyl estradiol (ORTHO-CYCLEN,SPRINTEC,PREVIFEM) 0.25-35 MG-MCG tablet Take 1 tablet by mouth 3 (three) times daily for 7 days. 08/09/18 08/16/18  Conard NovakJackson, Stephen D, MD  traMADol (ULTRAM) 50 MG tablet Take 1 tablet (50 mg total) by mouth every 6 (six) hours as needed. 02/17/19   Tommi RumpsSummers, Ryson Bacha L, PA-C    Allergies Patient has no known allergies.  Family History  Problem Relation Age of Onset  . Hypertension Mother     Social History Social History   Tobacco Use   . Smoking status: Never Smoker  . Smokeless tobacco: Never Used  Substance Use Topics  . Alcohol use: No  . Drug use: No    Review of Systems Constitutional: No fever/chills Eyes: No visual changes. ENT: No sore throat.  Positive for dental pain. Cardiovascular: Denies chest pain. Respiratory: Denies shortness of breath. Musculoskeletal: Negative for muscle aches. Skin: Negative for rash. Neurological: Negative for headaches, focal weakness or numbness. ____________________________________________   PHYSICAL EXAM:  VITAL SIGNS: ED Triage Vitals  Enc Vitals Group     BP 02/17/19 1317 (!) 162/97     Pulse Rate 02/17/19 1317 (!) 128     Resp 02/17/19 1317 18     Temp 02/17/19 1317 98.9 F (37.2 C)     Temp Source 02/17/19 1317 Oral     SpO2 02/17/19 1317 100 %     Weight 02/17/19 1314 245 lb (111.1 kg)     Height 02/17/19 1314 5\' 4"  (1.626 m)     Head Circumference --      Peak Flow --      Pain Score 02/17/19 1314 10     Pain Loc --      Pain Edu? --      Excl. in GC? --     Constitutional: Alert and oriented. Well appearing and in no acute distress. Eyes: Conjunctivae are normal.  Head: Atraumatic. Nose: No congestion/rhinnorhea. Mouth/Throat:  Mucous membranes are moist.  Oropharynx non-erythematous.  Upper left molar with filling and the lateral aspect of her tooth has avulsed.  Gums are swollen in that area and extremely sensitive to touch.  No drainage was noted. Neck: No stridor.   Hematological/Lymphatic/Immunilogical: No cervical lymphadenopathy. Cardiovascular: Normal rate, regular rhythm. Grossly normal heart sounds.  Good peripheral circulation. Respiratory: Normal respiratory effort.  No retractions. Lungs CTAB. Musculoskeletal: Moves upper and lower extremities with any difficulty.  Normal gait was noted. Neurologic:  Normal speech and language. No gross focal neurologic deficits are appreciated. No gait instability. Skin:  Skin is warm, dry and intact.  No rash noted. Psychiatric: Mood and affect are normal. Speech and behavior are normal.  ____________________________________________   LABS (all labs ordered are listed, but only abnormal results are displayed)  Labs Reviewed - No data to display  PROCEDURES  Procedure(s) performed (including Critical Care):  Procedures   ____________________________________________   INITIAL IMPRESSION / ASSESSMENT AND PLAN / ED COURSE  As part of my medical decision making, I reviewed the following data within the electronic MEDICAL RECORD NUMBER Notes from prior ED visits and [place] Controlled Substance Database  28 year old female presents to the ED with complaint of dental pain.  Patient's has a history of breaking off part of her tooth 2 months ago and has not seen a dentist due to insurance reasons.  Gums over the left upper molar is tender to touch.  Tooth does have a filling with what looks to be an avulsion of the tooth on the lateral aspect.  No obvious drainage is noted.  Patient was given list of dental clinics to pursue.  She was placed on amoxicillin 875 twice daily for 10 days and tramadol No. 12 as needed for pain. ____________________________________________   FINAL CLINICAL IMPRESSION(S) / ED DIAGNOSES  Final diagnoses:  Dental abscess     ED Discharge Orders         Ordered    amoxicillin (AMOXIL) 875 MG tablet  2 times daily     02/17/19 1441    traMADol (ULTRAM) 50 MG tablet  Every 6 hours PRN     02/17/19 1441           Note:  This document was prepared using Dragon voice recognition software and may include unintentional dictation errors.    Johnn Hai, PA-C 02/17/19 1450    Delman Kitten, MD 02/17/19 1715

## 2019-02-17 NOTE — ED Notes (Signed)
See triage note  Presents with increased dental pain   States she has had a cracked tooth  But developed a sharp pain this afternoon  Pt is tearful on arrival

## 2019-02-17 NOTE — Discharge Instructions (Addendum)
Begin taking antibiotics as directed.  Tramadol only if needed for pain.  Do not drive or operate machinery while taking the medication.  Also you may take Tylenol or ibuprofen with this medication if needed.  Call 1 of the clinics listed on your discharge papers for an appointment.  Be aware that the prospect he will clinic also takes walk ins   [place]  [place] [facility] and [place] [email or web address] [phone])  [facility] [phone])  [place] [phone])  [place] [phone])  [facility] [phone]) This clinic caters to the indigent population and is on a lottery system. Location: [employer] [facility], [employer], [place], [place] Clinic Hours: Wednesdays from 6pm - 9pm, patients seen by a lottery system. For dates, call or go to [email or web address] Services: Cleanings, fillings and simple extractions. Payment Options: DENTAL WORK IS FREE OF CHARGE. Bring proof of income or support. Best way to get seen: Arrive at 5:15 pm - this is a lottery, NOT first come/first serve, so arriving earlier will not increase your chances of being seen.     [facility] [phone] Select option 1 for emergencies   Location: [facility], [place], [address] [place] Clinic Hours: No walk-ins accepted - call the day before to schedule an appointment. Check in times are 9:30 am and 1:30 pm. Services: Simple extractions, temporary fillings, pulpectomy/pulp debridement, uncomplicated abscess drainage. Payment Options: PAYMENT IS DUE AT THE TIME OF SERVICE.  Fee is usually $100-200, additional surgical procedures (e.g. abscess drainage) may be extra. Cash, checks,  Visa/MasterCard accepted.  Can file Medicaid if patient is covered for dental - patient should call case worker to check. No discount for [facility] patients. Best way to get seen: MUST call the day before and get onto the schedule. Can usually be seen the next 1-2 days. No walk-ins accepted.     [place] [phone]   Location: [place], [place] Clinic Hours: M, W, Th, F 8am or 1:30pm, Tues 9a or 1:30 - first come/first served. Services: Simple extractions, temporary fillings, uncomplicated abscess drainage.  You do not need to be an [facility] resident. Payment Options: PAYMENT IS DUE AT THE TIME OF SERVICE. Dental insurance, otherwise sliding scale - bring proof of income or support. Depending on income and treatment needed, cost is usually $50-200. Best way to get seen: Arrive early as it is first come/first served.     [facility] [phone]   Location: [place] [facility] Hours: Mon-Thu 8a-5p Services: Most basic dental services including extractions and fillings. Payment Options: PAYMENT IS DUE AT THE TIME OF SERVICE. Sliding scale, up to 50% off - bring proof if income or support. Medicaid with dental option accepted. Best way to get seen: Call to schedule an appointment, can usually be seen within 2 weeks OR they will try to see walk-ins - show up at [facility] or 2p (you may have to wait).     [facility] [place] RESIDENTS ONLY   Location: [facility], [place] [address], [place], [place] [address] Clinic Hours: By appointment only. Monday - Thursday 8am-5pm, Friday 8am-12pm Services: Cleanings, fillings, extractions. Payment Options: PAYMENT IS DUE AT THE TIME OF SERVICE. Cash, [place] or  MasterCard. Sliding scale - $30 minimum per service. Best way to get seen: Come in to office, complete packet and  make an appointment - need proof of income or support monies for each household member and proof of [place] residence. Usually takes about a month to get in.     [facility] [phone]   Location: [address] [facility] Hours: Walk-in Urgent Care Dental Services are offered Monday-Friday mornings only. The numbers of emergencies accepted daily is limited to the number of providers available. Maximum 15 - Mondays, Wednesdays & Thursdays Maximum 10 - Tuesdays & Fridays Services: You do not need to be a [place] resident to be seen for a dental emergency. Emergencies are defined as pain, swelling, abnormal bleeding, or dental trauma. Walkins will receive x-rays if needed. NOTE: Dental cleaning is not an emergency. Payment Options: PAYMENT IS DUE AT THE TIME OF SERVICE. Minimum co-pay is $40.00 for uninsured patients. Minimum co-pay is $3.00 for Medicaid with dental coverage. Dental Insurance is accepted and must be presented at time of visit. Medicare does not cover dental. Forms of payment: Cash, credit card, checks. Best way to get seen: If not previously registered with the clinic, walk-in dental registration begins at 7:15 am and is on a first come/first serve basis. If previously registered with the clinic, call to make an appointment.     [facility] [phone] [place] RESIDENTS ONLY   Location: [address], [place], [place] Clinic Hours: Mon-Thu 10a-2p Services: Extractions only! Payment Options: FREE (donations accepted) - bring proof of income or support Best way to get seen: Call and schedule an appointment OR come at 8am on the 1st Monday of every month (except for holidays) when it is first come/first served.     [facility] [phone]   [address], [place] [facility] Hours: Friday mornings Services, Payment Options, Best way to get seen: Call for info

## 2019-02-17 NOTE — ED Triage Notes (Signed)
Pt here with dental pain that just began, tearful in triage.

## 2019-05-17 DIAGNOSIS — H5213 Myopia, bilateral: Secondary | ICD-10-CM | POA: Diagnosis not present

## 2020-03-02 ENCOUNTER — Emergency Department
Admission: EM | Admit: 2020-03-02 | Discharge: 2020-03-02 | Disposition: A | Discharge status: Home / Self Care | Payer: Medicaid Other | Attending: Emergency Medicine | Admitting: Emergency Medicine

## 2020-03-02 ENCOUNTER — Encounter: Payer: Self-pay | Admitting: Emergency Medicine

## 2020-03-02 ENCOUNTER — Other Ambulatory Visit: Payer: Self-pay

## 2020-03-02 DIAGNOSIS — R202 Paresthesia of skin: Secondary | ICD-10-CM | POA: Diagnosis present

## 2020-03-02 DIAGNOSIS — G5601 Carpal tunnel syndrome, right upper limb: Secondary | ICD-10-CM | POA: Diagnosis not present

## 2020-03-02 DIAGNOSIS — J45909 Unspecified asthma, uncomplicated: Secondary | ICD-10-CM | POA: Diagnosis not present

## 2020-03-02 MED ORDER — IBUPROFEN 600 MG PO TABS: 600.0000 mg | ORAL_TABLET | Freq: Four times a day (QID) | ORAL | 0 refills | Status: DC | PRN

## 2020-03-02 MED ORDER — PREDNISONE 10 MG PO TABS: ORAL_TABLET | ORAL | 0 refills | Status: DC

## 2020-03-02 NOTE — ED Triage Notes (Signed)
See triage note  Presents with numbness to right hand since Friday  Numbness is mainly to 3 fingers  Good pulses  Grips equal

## 2020-03-02 NOTE — ED Provider Notes (Signed)
[facility] Emergency Department Provider Note  ____________________________________________  Time seen: Approximately 3:49 PM  I have reviewed the triage vital signs and the nursing notes.   HISTORY  Chief Complaint Numbness    HPI Rachel Clayton is a 29 y.o. female that presents to the emergency department for evaluation of numbness to the right thumb, second, third finger on the palmar side for 3 days.  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  She is right-handed.  Her mother has carpal tunnel.  She has never had trouble with carpal tunnel before.  No trauma.  No shortness of breath, chest pain.   Past Medical History:  Diagnosis Date  . Asthma   . BMI 40.0-44.9, adult (HCC)   . Recurrent UTI     Patient Active Problem List   Diagnosis Date Noted  . Acute blood loss anemia 07/22/2018  . Menorrhagia with irregular cycle 07/22/2018  . Anemia associated with acute blood loss 07/22/2018    Past Surgical History:  Procedure Laterality Date  . CESAREAN SECTION    . HAND SURGERY      Prior to Admission medications   Medication Sig Start Date End Date Taking? Authorizing Provider  ibuprofen (ADVIL) 600 MG tablet Take 1 tablet (600 mg total) by mouth every 6 (six) hours as needed. 03/02/20   Enid Derry, PA-C  Norethin Ace-Eth Estrad-FE (TAYTULLA) 1-20 MG-MCG(24) CAPS Take 1 capsule by mouth daily. 08/02/18 11/26/18  Conard Novak, MD  norgestimate-ethinyl estradiol (ORTHO-CYCLEN,SPRINTEC,PREVIFEM) 0.25-35 MG-MCG tablet Take 1 tablet by mouth 3 (three) times daily for 7 days. 08/09/18 08/16/18  Conard Novak, MD  predniSONE (DELTASONE) 10 MG tablet Take 6 tablets on day 1, take 5 tablets on day 2, take 4 tablets on day 3, take 3 tablets on day 4, take 2 tablets on day 5, take 1 tablet on day 6 03/02/20   Enid Derry, PA-C    Allergies Patient has no known allergies.  Family History  Problem Relation Age  of Onset  . Hypertension Mother     Social History Social History   Tobacco Use  . Smoking status: Never Smoker  . Smokeless tobacco: Never Used  Substance Use Topics  . Alcohol use: No  . Drug use: No     Review of Systems  Cardiovascular: No chest pain. Respiratory: No SOB. Gastrointestinal: No abdominal pain.  No nausea, no vomiting.  Musculoskeletal: Negative for musculoskeletal pain. Skin: Negative for rash, abrasions, lacerations, ecchymosis. Neurological: Negative for headaches. Positive for numbness and tingling to right first, second, third finger on the palmar side.   ____________________________________________   PHYSICAL EXAM:  VITAL SIGNS: ED Triage Vitals  Enc Vitals Group     BP 03/02/20 1347 132/77     Pulse Rate 03/02/20 1347 94     Resp 03/02/20 1347 18     Temp 03/02/20 1347 98.5 F (36.9 C)     Temp Source 03/02/20 1347 Oral     SpO2 03/02/20 1347 100 %     Weight 03/02/20 1347 240 lb (108.9 kg)     Height 03/02/20 1347 5\' 4"  (1.626 m)     Head Circumference --      Peak Flow --      Pain Score 03/02/20 1351 2     Pain Loc --      Pain Edu? --      Excl. in GC? --  Constitutional: Alert and oriented. Well appearing and in no acute distress. Eyes: Conjunctivae are normal. PERRL. EOMI. Head: Atraumatic. ENT:      Ears:      Nose: No congestion/rhinnorhea.      Mouth/Throat: Mucous membranes are moist.  Neck: No stridor.  Cardiovascular: Normal rate, regular rhythm.  Good peripheral circulation. Respiratory: Normal respiratory effort without tachypnea or retractions. Lungs CTAB. Good air entry to the bases with no decreased or absent breath sounds. Musculoskeletal: Full range of motion to all extremities. No gross deformities appreciated. Neurologic: Normal speech and language. No gross focal neurologic deficits are appreciated.  Cranial nerves: 2-10 normal as tested. Strength 5/5 in upper and lower extremities Cerebellar:  Finger-nose-finger WNL, Heel to shin WNL Sensorimotor: No pronator drift, clonus, or abnormal reflexes.  Subjective numbness to the palmar side of first, second, third fingers on right hand. Speech: No dysarthria or expressive aphasia Skin:  Skin is warm, dry and intact. No rash noted. Psychiatric: Mood and affect are normal. Speech and behavior are normal. Patient exhibits appropriate insight and judgement.   ____________________________________________   LABS (all labs ordered are listed, but only abnormal results are displayed)  Labs Reviewed - No data to display ____________________________________________  EKG   ____________________________________________  RADIOLOGY   No results found.  ____________________________________________    PROCEDURES  Procedure(s) performed:    Procedures    Medications - No data to display   ____________________________________________   INITIAL IMPRESSION / ASSESSMENT AND PLAN / ED COURSE  Pertinent labs & imaging results that were available during my care of the patient were reviewed by me and considered in my medical decision making (see chart for details).  Review of the [place] CSRS was performed in accordance of the [facility] prior to dispensing any controlled drugs.   Patient's diagnosis is consistent with carpal tunnel.  Vital signs and exam are reassuring.  Wrist splint was placed.  Patient will be discharged home with prescriptions for prednisone. Patient is to follow up with hand orthopedics as directed. Patient is given ED precautions to return to the ED for any worsening or new symptoms.   Rachel Clayton was evaluated in Emergency Department on 03/02/2020 for the symptoms described in the history of present illness. She was evaluated in the context of the global COVID-19 pandemic, which necessitated consideration that the patient might be at risk for infection with the SARS-CoV-2 virus that causes COVID-19. Institutional  protocols and algorithms that pertain to the evaluation of patients at risk for COVID-19 are in a state of rapid change based on information released by regulatory bodies including the CDC and federal and state organizations. These policies and algorithms were followed during the patient's care in the ED.  ____________________________________________  FINAL CLINICAL IMPRESSION(S) / ED DIAGNOSES  Final diagnoses:  Carpal tunnel syndrome of right wrist      NEW MEDICATIONS STARTED DURING THIS VISIT:  ED Discharge Orders         Ordered    predniSONE (DELTASONE) 10 MG tablet     Discontinue  Reprint     03/02/20 1550    ibuprofen (ADVIL) 600 MG tablet  Every 6 hours PRN     Discontinue  Reprint     03/02/20 1551              This chart was dictated using voice recognition software/Dragon. Despite best efforts to proofread, errors can occur which can change the meaning. Any change was purely unintentional.    [address],  [place] 03/02/20 2332    Phineas Semen, MD 03/03/20 [phone]

## 2020-03-11 DIAGNOSIS — G5601 Carpal tunnel syndrome, right upper limb: Secondary | ICD-10-CM | POA: Diagnosis not present

## 2020-04-01 DIAGNOSIS — G5601 Carpal tunnel syndrome, right upper limb: Secondary | ICD-10-CM | POA: Diagnosis not present

## 2021-01-12 ENCOUNTER — Other Ambulatory Visit: Payer: Self-pay

## 2021-01-12 ENCOUNTER — Emergency Department
Admission: EM | Admit: 2021-01-12 | Discharge: 2021-01-12 | Disposition: A | Discharge status: Home / Self Care | Payer: Medicaid Other | Attending: Emergency Medicine | Admitting: Emergency Medicine

## 2021-01-12 ENCOUNTER — Emergency Department: Payer: Medicaid Other

## 2021-01-12 DIAGNOSIS — Z28310 Unvaccinated for covid-19: Secondary | ICD-10-CM | POA: Diagnosis not present

## 2021-01-12 DIAGNOSIS — R059 Cough, unspecified: Secondary | ICD-10-CM | POA: Diagnosis not present

## 2021-01-12 DIAGNOSIS — Z20822 Contact with and (suspected) exposure to covid-19: Secondary | ICD-10-CM | POA: Insufficient documentation

## 2021-01-12 DIAGNOSIS — J45909 Unspecified asthma, uncomplicated: Secondary | ICD-10-CM | POA: Insufficient documentation

## 2021-01-12 DIAGNOSIS — B349 Viral infection, unspecified: Secondary | ICD-10-CM | POA: Diagnosis not present

## 2021-01-12 DIAGNOSIS — R509 Fever, unspecified: Secondary | ICD-10-CM | POA: Diagnosis present

## 2021-01-12 MED ORDER — IBUPROFEN 800 MG PO TABS: 800.0000 mg | ORAL_TABLET | Freq: Three times a day (TID) | ORAL | 0 refills | Status: DC | PRN

## 2021-01-12 MED ORDER — PSEUDOEPH-BROMPHEN-DM 30-2-10 MG/5ML PO SYRP: 5.0000 mL | ORAL_SOLUTION | Freq: Four times a day (QID) | ORAL | 0 refills | Status: DC | PRN

## 2021-01-12 NOTE — Discharge Instructions (Signed)
Your chest x-ray of revealed no acute findings.  Your COVID-19 test results are pending.  Advised self quarantine pending results which should be available later in the MyChart app.  Follow discharge care instruction take medication as directed.

## 2021-01-12 NOTE — ED Notes (Signed)
See triage note  Presents with cough,fatigue and subjective fever since Saturday  Afebrile on arrival to ED

## 2021-01-12 NOTE — ED Triage Notes (Signed)
Pt comes with c/o cough and fever for about 3 days. Pt states nonproductive cough.

## 2021-01-12 NOTE — ED Provider Notes (Signed)
[facility] Emergency Department Provider Note   ____________________________________________   Event Date/Time   First MD Initiated Contact with Patient 01/12/21 1419     (approximate)  I have reviewed the triage vital signs and the nursing notes.   HISTORY  Chief Complaint Cough and Fever    HPI Rachel Clayton is a 30 y.o. female patient complain of fever, cough, and chest congestion for 3 days.  Patient denies recent travel or known contact with COVID-19.  Patient is not taking the COVID-vaccine or the flu shot shot for the season.  Patient states she started a new job 3 days ago and came home sick on the second day.         Past Medical History:  Diagnosis Date  . Asthma   . BMI 40.0-44.9, adult (HCC)   . Recurrent UTI     Patient Active Problem List   Diagnosis Date Noted  . Acute blood loss anemia 07/22/2018  . Menorrhagia with irregular cycle 07/22/2018  . Anemia associated with acute blood loss 07/22/2018    Past Surgical History:  Procedure Laterality Date  . CESAREAN SECTION    . HAND SURGERY      Prior to Admission medications   Medication Sig Start Date End Date Taking? Authorizing Provider  brompheniramine-pseudoephedrine-DM 30-2-10 MG/5ML syrup Take 5 mLs by mouth 4 (four) times daily as needed. 01/12/21  Yes Joni Reining, PA-C  ibuprofen (ADVIL) 800 MG tablet Take 1 tablet (800 mg total) by mouth every 8 (eight) hours as needed for moderate pain. 01/12/21  Yes Joni Reining, PA-C  ibuprofen (ADVIL) 600 MG tablet Take 1 tablet (600 mg total) by mouth every 6 (six) hours as needed. 03/02/20   Enid Derry, PA-C  Norethin Ace-Eth Estrad-FE (TAYTULLA) 1-20 MG-MCG(24) CAPS Take 1 capsule by mouth daily. 08/02/18 11/26/18  Conard Novak, MD  norgestimate-ethinyl estradiol (ORTHO-CYCLEN,SPRINTEC,PREVIFEM) 0.25-35 MG-MCG tablet Take 1 tablet by mouth 3 (three) times daily for 7 days. 08/09/18 08/16/18  Conard Novak, MD   predniSONE (DELTASONE) 10 MG tablet Take 6 tablets on day 1, take 5 tablets on day 2, take 4 tablets on day 3, take 3 tablets on day 4, take 2 tablets on day 5, take 1 tablet on day 6 03/02/20   Enid Derry, PA-C    Allergies Patient has no known allergies.  Family History  Problem Relation Age of Onset  . Hypertension Mother     Social History Social History   Tobacco Use  . Smoking status: Never Smoker  . Smokeless tobacco: Never Used  Substance Use Topics  . Alcohol use: No  . Drug use: No    Review of Systems Constitutional: Fever. Eyes: No visual changes. ENT: No sore throat. Cardiovascular: Denies chest pain. Respiratory: Denies shortness of breath.  Nonproductive cough. Gastrointestinal: No abdominal pain.  No nausea, no vomiting.  No diarrhea.  No constipation. Genitourinary: Negative for dysuria. Musculoskeletal: Negative for back pain. Skin: Negative for rash. Neurological: Negative for headaches, focal weakness or numbness.  ____________________________________________   PHYSICAL EXAM:  VITAL SIGNS: ED Triage Vitals  Enc Vitals Group     BP 01/12/21 1318 (!) 136/95     Pulse Rate 01/12/21 1318 71     Resp 01/12/21 1318 19     Temp 01/12/21 1318 98.5 F (36.9 C)     Temp Source 01/12/21 1318 Oral     SpO2 01/12/21 1318 100 %     Weight 01/12/21  1406 238 lb 1.6 oz (108 kg)     Height 01/12/21 1406 5\' 4"  (1.626 m)     Head Circumference --      Peak Flow --      Pain Score 01/12/21 1317 5     Pain Loc --      Pain Edu? --      Excl. in GC? --     Constitutional: Alert and oriented. Well appearing and in no acute distress. Eyes: Conjunctivae are normal. PERRL. EOMI. Head: Atraumatic. Nose: No congestion/rhinnorhea. Mouth/Throat: Mucous membranes are moist.  Oropharynx non-erythematous. Neck: No stridor.  Hematological/Lymphatic/Immunilogical: No cervical lymphadenopathy. Cardiovascular: Normal rate, regular rhythm. Grossly normal heart  sounds.  Good peripheral circulation. Respiratory: Normal respiratory effort.  No retractions. Lungs CTAB. Gastrointestinal: Soft and nontender. No distention. No abdominal bruits. No CVA tenderness. Genitourinary: Deferred Musculoskeletal: No lower extremity tenderness nor edema.  No joint effusions. Neurologic:  Normal speech and language. No gross focal neurologic deficits are appreciated. No gait instability. Skin:  Skin is warm, dry and intact. No rash noted. Psychiatric: Mood and affect are normal. Speech and behavior are normal.  ____________________________________________   LABS (all labs ordered are listed, but only abnormal results are displayed)  Labs Reviewed  SARS CORONAVIRUS 2 (TAT 6-24 HRS)   ____________________________________________  EKG   ____________________________________________  RADIOLOGY I, 01/14/21, personally viewed and evaluated these images (plain radiographs) as part of my medical decision making, as well as reviewing the written report by the radiologist.  ED MD interpretation: No acute findings on chest x-ray.  Official radiology report(s): DG Chest Portable 1 View  Result Date: 01/12/2021 CLINICAL DATA:  Fever, cough, body aches, chills EXAM: PORTABLE CHEST 1 VIEW COMPARISON:  07/17/2011 chest radiograph. FINDINGS: Stable cardiomediastinal silhouette with top-normal heart size. No pneumothorax. No pleural effusion. Lungs appear clear, with no acute consolidative airspace disease and no pulmonary edema. IMPRESSION: No active disease. Electronically Signed   By: 14/09/2010 M.D.   On: 01/12/2021 14:49    ____________________________________________   PROCEDURES  Procedure(s) performed (including Critical Care):  Procedures   ____________________________________________   INITIAL IMPRESSION / ASSESSMENT AND PLAN / ED COURSE  As part of my medical decision making, I reviewed the following data within the electronic medical  record:          Patient presents with fever and cough.  Patient's not been vaccinated for COVID-19.  Discussed no acute findings on chest x-ray.  Patient complaint and physical exam consistent with viral illness.  Patient given discharge care instruction advised self quarantine pending results of COVID-19 test.  Take medication as directed.      ____________________________________________   FINAL CLINICAL IMPRESSION(S) / ED DIAGNOSES  Final diagnoses:  Viral illness     ED Discharge Orders         Ordered    brompheniramine-pseudoephedrine-DM 30-2-10 MG/5ML syrup  4 times daily PRN        01/12/21 1618    ibuprofen (ADVIL) 800 MG tablet  Every 8 hours PRN        01/12/21 1618           Note:  This document was prepared using Dragon voice recognition software and may include unintentional dictation errors.    01/14/21, PA-C 01/12/21 1619    01/14/21, MD 01/13/21 1556

## 2021-01-13 LAB — SARS CORONAVIRUS 2 (TAT 6-24 HRS): SARS Coronavirus 2: NEGATIVE

## 2021-02-08 ENCOUNTER — Other Ambulatory Visit: Payer: Self-pay

## 2021-02-08 ENCOUNTER — Ambulatory Visit (LOCAL_COMMUNITY_HEALTH_CENTER): Payer: Medicaid Other | Admitting: Advanced Practice Midwife

## 2021-02-08 ENCOUNTER — Encounter: Payer: Self-pay | Admitting: Advanced Practice Midwife

## 2021-02-08 VITALS — BP 138/93 | Ht 64.0 in | Wt 246.6 lb

## 2021-02-08 DIAGNOSIS — L680 Hirsutism: Secondary | ICD-10-CM | POA: Insufficient documentation

## 2021-02-08 DIAGNOSIS — Z01419 Encounter for gynecological examination (general) (routine) without abnormal findings: Secondary | ICD-10-CM

## 2021-02-08 DIAGNOSIS — J45909 Unspecified asthma, uncomplicated: Secondary | ICD-10-CM

## 2021-02-08 DIAGNOSIS — E669 Obesity, unspecified: Secondary | ICD-10-CM

## 2021-02-08 DIAGNOSIS — A599 Trichomoniasis, unspecified: Secondary | ICD-10-CM | POA: Insufficient documentation

## 2021-02-08 DIAGNOSIS — R030 Elevated blood-pressure reading, without diagnosis of hypertension: Secondary | ICD-10-CM

## 2021-02-08 DIAGNOSIS — Z3009 Encounter for other general counseling and advice on contraception: Secondary | ICD-10-CM

## 2021-02-08 LAB — HEMOGLOBIN, FINGERSTICK: Hemoglobin: 10.5 g/dL — ABNORMAL LOW (ref 11.1–15.9)

## 2021-02-08 LAB — WET PREP FOR TRICH, YEAST, CLUE
Trichomonas Exam: POSITIVE — AB
Yeast Exam: NEGATIVE

## 2021-02-08 MED ORDER — METRONIDAZOLE 500 MG PO TABS: 500.0000 mg | ORAL_TABLET | Freq: Two times a day (BID) | ORAL | 0 refills | Status: AC

## 2021-02-08 NOTE — Progress Notes (Signed)
Wet mount reviewed, patient treated for trich, partner cards given. Burt Knack, RN

## 2021-02-08 NOTE — Progress Notes (Signed)
Rachel Clayton Main Number: [phone]    Family Planning Visit- Initial Visit  Subjective:  Rachel Clayton is a 30 y.o. SHF [id number](07, 8, 7, 3) exsmoker   being seen today for an initial annual visit and to discuss contraceptive options.  The patient is currently using None for pregnancy prevention. Patient reports she does not want a pregnancy in the next year.  Patient has the following medical conditions has Acute blood loss anemia; Menorrhagia with irregular cycle; Anemia associated with acute blood loss; Obesity BMI=42.3; Elevated blood pressure reading 138/93 on 02/08/21; and Hirsutism on their problem list.  Chief Complaint  Patient presents with   Annual Exam    Needs Pap    Patient reports last sex 02/04/21 without condom; with current partner x10+ years; 2 sex partners in last 3 mo. LMP 01/21/21.  Last MJ 12/2020. Last ETOH 02/05/21 (2 shots Henessey) 3-4x/mo. Unemployed and not in school. Living with her 4 kids. Her son gets survivor benefits because his dad died. Last pap 01-04-2017 neg. Last cig 12/2020.   Patient denies vaping, cigars  Body mass index is 42.33 kg/m. - Patient is eligible for diabetes screening based on BMI and age >81?  not applicable HA1C ordered? no  Patient reports 2  partner/s in last year. Desires STI screening?  Yes  Has patient been screened once for HCV in the past?  No  No results found for: HCVAB  Does the patient have current drug use (including MJ), have a partner with drug use, and/or has been incarcerated since last result? Yes  If yes-- Screen for HCV through [facility]   Does the patient meet criteria for HBV testing? No  Criteria:  -Household, sexual or needle sharing contact with HBV -History of drug use -HIV positive -Those with known Hep C   Health Maintenance Due  Topic Date Due   COVID-19 Vaccine (1) Never done   Hepatitis C Screening  Never done    PAP-Cervical Cytology Screening  Jan 05, 2020   PAP SMEAR-Modifier  Jan 05, 2020    Review of Systems  All other systems reviewed and are negative.  The following portions of the patient's history were reviewed and updated as appropriate: allergies, current medications, past family history, past medical history, past social history, past surgical history and problem list. Problem list updated.   See flowsheet for other program required questions.  Objective:   Vitals:   02/08/21 1017  BP: (!) 138/93  Weight: 246 lb 9.6 oz (111.9 kg)  Height: 5\' 4"  (1.626 m)    Physical Exam Constitutional:      Appearance: Normal appearance. She is obese.  HENT:     Head: Normocephalic and atraumatic.     Mouth/Throat:     Mouth: Mucous membranes are moist.     Comments: Last dental exam 2021; urged exam asap Eyes:     Conjunctiva/sclera: Conjunctivae normal.  Neck:     Thyroid: No thyroid mass, thyromegaly or thyroid tenderness.  Cardiovascular:     Rate and Rhythm: Normal rate and regular rhythm.  Pulmonary:     Effort: Pulmonary effort is normal.     Breath sounds: Normal breath sounds.  Chest:  Breasts:    Right: Normal.     Left: Normal.  Abdominal:     Palpations: Abdomen is soft.     Comments: Hirsuit arms, legs, abdomen Soft, poor tone, increased adipose without masses or tenderness  Genitourinary:  General: Normal vulva.     Exam position: Lithotomy position.     Vagina: Vaginal discharge (grey malodorous leukorrhea, ph>4.5) present.     Cervix: Normal.     Rectum: Normal.     Comments: Difficult to assess uterus and adnexa due to increased adipose, poor tone, soft without masses or tenderness Pap done Musculoskeletal:        General: Normal range of motion.     Cervical back: Normal range of motion and neck supple.  Skin:    General: Skin is warm and dry.  Neurological:     Mental Status: She is alert.  Psychiatric:        Mood and Affect: Mood normal.       Assessment and Plan:  BRAXTON WEISBECKER is a 30 y.o. female presenting to the [facility] Department for an initial annual wellness/contraceptive visit  Contraception counseling: Reviewed all forms of birth control options in the tiered based approach. available including abstinence; over the counter/barrier methods; hormonal contraceptive medication including pill, patch, ring, injection,contraceptive implant, ECP; hormonal and nonhormonal IUDs; permanent sterilization options including vasectomy and the various tubal sterilization modalities. Risks, benefits, and typical effectiveness rates were reviewed.  Questions were answered.  Written information was also given to the patient to review.  Patient desires condoms only this was prescribed for patient. She will follow up in prn for surveillance.  She was told to call with any further questions, or with any concerns about this method of contraception.  Emphasized use of condoms 100% of the time for STI prevention.  Patient was offered ECP. ECP was not accepted by the patient. ECP counseling was not given - see RN documentation  1. Family planning Treat BV per standing orders Immunization nurse consult Treat wet mount per standing orders Please offer condoms - WET PREP FOR TRICH, YEAST, CLUE - Hemoglobin, venipuncture - Syphilis Serology, [place] [facility] - HIV/HCV [place] [facility] - Chlamydia/Gonorrhea [place] [facility] - IGP, rfx Aptima HPV ASCU  2. Well woman exam with routine gynecological exam   3. Obesity, unspecified classification, unspecified obesity type, unspecified whether serious comorbidity present  4. Elevated blood pressure reading 138/93 on 02/08/21 Please give pt primary care MD list (pt states Phine[facility] Real doesn't have any openings until 03/2021 so she wants a new primary care MD)  5. Hirsutism      No follow-ups on file.  No future appointments.  Alberteen Spindle, CNM

## 2021-02-08 NOTE — Progress Notes (Signed)
Patient here for PE and Pap. Last Pap was 12/14/2016, NIL. Using condoms for [facility].Marland KitchenBurt Knack, RN

## 2021-02-08 NOTE — Addendum Note (Signed)
Addended by: Burt Knack on: 02/08/2021 11:45 AM   Modules accepted: Orders

## 2021-02-10 LAB — HM HEPATITIS C SCREENING LAB: HM Hepatitis Screen: NEGATIVE

## 2021-02-10 LAB — HM HIV SCREENING LAB: HM HIV Screening: NEGATIVE

## 2021-02-11 ENCOUNTER — Telehealth: Payer: Self-pay

## 2021-02-11 ENCOUNTER — Other Ambulatory Visit (LOCAL_COMMUNITY_HEALTH_CENTER): Payer: Medicaid Other

## 2021-02-11 ENCOUNTER — Other Ambulatory Visit: Payer: Self-pay

## 2021-02-11 DIAGNOSIS — D649 Anemia, unspecified: Secondary | ICD-10-CM

## 2021-02-11 LAB — IGP, RFX APTIMA HPV ASCU: PAP Smear Comment: 0

## 2021-02-11 MED ORDER — FERROUS SULFATE 325 (65 FE) MG PO TABS: 325.0000 mg | ORAL_TABLET | Freq: Two times a day (BID) | ORAL | 0 refills | Status: DC

## 2021-02-11 NOTE — Progress Notes (Signed)
In Nurse Clinic to receive iron supplement. See RN note from earlier today (02/11/2021.) Pt had low hgb (10.5) on 02/08/2021. Ferrous Sulfate 325 mg #100 tabs dispensed per SO Dr Ralene Bathe with instructions to take one pill by mouth twice daily with meals and vit c juice. Advised to maintain adequate water intake and increase fruit/veg in diet.  List of iron rich foods given and explained. Per SO, recommended hgb re check in 4 weeks to check compliance and level. Pt has reminder. Questions answered and reports understanding. Jerel Shepherd, RN

## 2021-02-11 NOTE — Telephone Encounter (Signed)
TC to patient to inform of low hemoglobin and needs for FeSO4 supplement. Patient was here on 02/08/2021 for [facility] appointment and was not given iron at that time for Hgb of 10.5. Patient needs to take 1 iron tablet twice each day with vitamin C juice. She needs to have Hgb rechecked in about 6 months per SO. Patient scheduled to come in today to receive iron.Burt Knack, RN

## 2021-03-26 ENCOUNTER — Other Ambulatory Visit: Payer: Self-pay

## 2021-03-26 ENCOUNTER — Emergency Department
Admission: EM | Admit: 2021-03-26 | Discharge: 2021-03-26 | Disposition: A | Discharge status: Home / Self Care | Payer: Medicaid Other | Attending: Emergency Medicine | Admitting: Emergency Medicine

## 2021-03-26 DIAGNOSIS — Z5321 Procedure and treatment not carried out due to patient leaving prior to being seen by health care provider: Secondary | ICD-10-CM | POA: Insufficient documentation

## 2021-03-26 DIAGNOSIS — R61 Generalized hyperhidrosis: Secondary | ICD-10-CM | POA: Insufficient documentation

## 2021-03-26 DIAGNOSIS — R1110 Vomiting, unspecified: Secondary | ICD-10-CM | POA: Insufficient documentation

## 2021-03-26 LAB — CBC
HCT: 36.4 % (ref 36.0–46.0)
Hemoglobin: 11.3 g/dL — ABNORMAL LOW (ref 12.0–15.0)
MCH: 21 pg — ABNORMAL LOW (ref 26.0–34.0)
MCHC: 31 g/dL (ref 30.0–36.0)
MCV: 67.8 fL — ABNORMAL LOW (ref 80.0–100.0)
Platelets: 232 10*3/uL (ref 150–400)
RBC: 5.37 MIL/uL — ABNORMAL HIGH (ref 3.87–5.11)
RDW: 18.5 % — ABNORMAL HIGH (ref 11.5–15.5)
WBC: 16.2 10*3/uL — ABNORMAL HIGH (ref 4.0–10.5)
nRBC: 0 % (ref 0.0–0.2)

## 2021-03-26 LAB — COMPREHENSIVE METABOLIC PANEL
ALT: 18 U/L (ref 0–44)
AST: 19 U/L (ref 15–41)
Albumin: 4 g/dL (ref 3.5–5.0)
Alkaline Phosphatase: 57 U/L (ref 38–126)
Anion gap: 10 (ref 5–15)
BUN: 8 mg/dL (ref 6–20)
CO2: 21 mmol/L — ABNORMAL LOW (ref 22–32)
Calcium: 8.9 mg/dL (ref 8.9–10.3)
Chloride: 100 mmol/L (ref 98–111)
Creatinine, Ser: 0.65 mg/dL (ref 0.44–1.00)
GFR, Estimated: >60 mL/min (ref >60)
Glucose, Bld: 135 mg/dL — ABNORMAL HIGH (ref 70–99)
Potassium: 3.4 mmol/L — ABNORMAL LOW (ref 3.5–5.1)
Sodium: 131 mmol/L — ABNORMAL LOW (ref 135–145)
Total Bilirubin: 1.3 mg/dL — ABNORMAL HIGH (ref 0.3–1.2)
Total Protein: 8 g/dL (ref 6.5–8.1)

## 2021-03-26 LAB — LIPASE, BLOOD: Lipase: 23 U/L (ref 11–51)

## 2021-03-26 MED ORDER — ONDANSETRON 4 MG PO TBDP
4.0000 mg | ORAL_TABLET | Freq: Once | ORAL | Status: AC | PRN
  Administered 2021-03-26: 4 mg via ORAL
  Filled 2021-03-26: qty 1

## 2021-03-26 NOTE — ED Notes (Signed)
Called for treatment room no answer. 

## 2021-03-26 NOTE — ED Notes (Signed)
Called again for treatment room - no answer, unable to find in lobby or outside

## 2021-03-26 NOTE — ED Triage Notes (Addendum)
X2nights nights sweats and vomiting, 'i think its my iron,' reports hgb last month was 7 at pmd and was placed on iron pills. denies blood in vomit and denies diarrhea

## 2021-03-27 ENCOUNTER — Other Ambulatory Visit: Payer: Self-pay

## 2021-03-27 ENCOUNTER — Inpatient Hospital Stay
Admission: EM | Admit: 2021-03-27 | Discharge: 2021-03-30 | DRG: 872 | Disposition: A | Discharge status: Home / Self Care | Payer: Medicaid Other | Attending: Internal Medicine | Admitting: Internal Medicine

## 2021-03-27 ENCOUNTER — Emergency Department: Payer: Medicaid Other

## 2021-03-27 DIAGNOSIS — R7303 Prediabetes: Secondary | ICD-10-CM | POA: Diagnosis present

## 2021-03-27 DIAGNOSIS — N289 Disorder of kidney and ureter, unspecified: Secondary | ICD-10-CM | POA: Diagnosis present

## 2021-03-27 DIAGNOSIS — Z87440 Personal history of urinary (tract) infections: Secondary | ICD-10-CM

## 2021-03-27 DIAGNOSIS — K760 Fatty (change of) liver, not elsewhere classified: Secondary | ICD-10-CM | POA: Diagnosis not present

## 2021-03-27 DIAGNOSIS — Z793 Long term (current) use of hormonal contraceptives: Secondary | ICD-10-CM

## 2021-03-27 DIAGNOSIS — R000 Tachycardia, unspecified: Secondary | ICD-10-CM | POA: Diagnosis not present

## 2021-03-27 DIAGNOSIS — R112 Nausea with vomiting, unspecified: Secondary | ICD-10-CM | POA: Diagnosis not present

## 2021-03-27 DIAGNOSIS — E669 Obesity, unspecified: Secondary | ICD-10-CM | POA: Diagnosis not present

## 2021-03-27 DIAGNOSIS — A419 Sepsis, unspecified organism: Secondary | ICD-10-CM | POA: Diagnosis present

## 2021-03-27 DIAGNOSIS — R509 Fever, unspecified: Secondary | ICD-10-CM | POA: Diagnosis not present

## 2021-03-27 DIAGNOSIS — L680 Hirsutism: Secondary | ICD-10-CM | POA: Diagnosis not present

## 2021-03-27 DIAGNOSIS — Z8249 Family history of ischemic heart disease and other diseases of the circulatory system: Secondary | ICD-10-CM

## 2021-03-27 DIAGNOSIS — R1011 Right upper quadrant pain: Secondary | ICD-10-CM

## 2021-03-27 DIAGNOSIS — A4151 Sepsis due to Escherichia coli [E. coli]: Principal | ICD-10-CM | POA: Diagnosis present

## 2021-03-27 DIAGNOSIS — Z6841 Body Mass Index (BMI) 40.0 and over, adult: Secondary | ICD-10-CM

## 2021-03-27 DIAGNOSIS — Z98891 History of uterine scar from previous surgery: Secondary | ICD-10-CM

## 2021-03-27 DIAGNOSIS — Z28310 Unvaccinated for covid-19: Secondary | ICD-10-CM

## 2021-03-27 DIAGNOSIS — I82890 Acute embolism and thrombosis of other specified veins: Secondary | ICD-10-CM

## 2021-03-27 DIAGNOSIS — R1031 Right lower quadrant pain: Secondary | ICD-10-CM | POA: Diagnosis not present

## 2021-03-27 DIAGNOSIS — Z20822 Contact with and (suspected) exposure to covid-19: Secondary | ICD-10-CM | POA: Diagnosis present

## 2021-03-27 DIAGNOSIS — I10 Essential (primary) hypertension: Secondary | ICD-10-CM | POA: Diagnosis not present

## 2021-03-27 DIAGNOSIS — N12 Tubulo-interstitial nephritis, not specified as acute or chronic: Secondary | ICD-10-CM

## 2021-03-27 DIAGNOSIS — N921 Excessive and frequent menstruation with irregular cycle: Secondary | ICD-10-CM | POA: Diagnosis present

## 2021-03-27 LAB — LACTIC ACID, PLASMA: Lactic Acid, Venous: 1.1 mmol/L (ref 0.5–1.9)

## 2021-03-27 LAB — COMPREHENSIVE METABOLIC PANEL
ALT: 21 U/L (ref 0–44)
AST: 20 U/L (ref 15–41)
Albumin: 3.6 g/dL (ref 3.5–5.0)
Alkaline Phosphatase: 59 U/L (ref 38–126)
Anion gap: 9 (ref 5–15)
BUN: 10 mg/dL (ref 6–20)
CO2: 22 mmol/L (ref 22–32)
Calcium: 8.7 mg/dL — ABNORMAL LOW (ref 8.9–10.3)
Chloride: 103 mmol/L (ref 98–111)
Creatinine, Ser: 0.89 mg/dL (ref 0.44–1.00)
GFR, Estimated: >60 mL/min (ref >60)
Glucose, Bld: 130 mg/dL — ABNORMAL HIGH (ref 70–99)
Potassium: 3.1 mmol/L — ABNORMAL LOW (ref 3.5–5.1)
Sodium: 134 mmol/L — ABNORMAL LOW (ref 135–145)
Total Bilirubin: 1.6 mg/dL — ABNORMAL HIGH (ref 0.3–1.2)
Total Protein: 7.9 g/dL (ref 6.5–8.1)

## 2021-03-27 LAB — SAMPLE TO BLOOD BANK

## 2021-03-27 LAB — URINALYSIS, COMPLETE (UACMP) WITH MICROSCOPIC
Bilirubin Urine: NEGATIVE
Glucose, UA: NEGATIVE mg/dL
Ketones, ur: 5 mg/dL — AB
Nitrite: NEGATIVE
Protein, ur: 30 mg/dL — AB
Specific Gravity, Urine: 1.003 — ABNORMAL LOW (ref 1.005–1.030)
WBC, UA: >50 WBC/hpf — ABNORMAL HIGH (ref 0–5)
pH: 7 (ref 5.0–8.0)

## 2021-03-27 LAB — CBC WITH DIFFERENTIAL/PLATELET
Abs Immature Granulocytes: 0.14 10*3/uL — ABNORMAL HIGH (ref 0.00–0.07)
Basophils Absolute: 0.1 10*3/uL (ref 0.0–0.1)
Basophils Relative: 0 %
Eosinophils Absolute: 0 10*3/uL (ref 0.0–0.5)
Eosinophils Relative: 0 %
HCT: 33.4 % — ABNORMAL LOW (ref 36.0–46.0)
Hemoglobin: 10.6 g/dL — ABNORMAL LOW (ref 12.0–15.0)
Immature Granulocytes: 1 %
Lymphocytes Relative: 2 %
Lymphs Abs: 0.4 10*3/uL — ABNORMAL LOW (ref 0.7–4.0)
MCH: 21.8 pg — ABNORMAL LOW (ref 26.0–34.0)
MCHC: 31.7 g/dL (ref 30.0–36.0)
MCV: 68.7 fL — ABNORMAL LOW (ref 80.0–100.0)
Monocytes Absolute: 0.9 10*3/uL (ref 0.1–1.0)
Monocytes Relative: 5 %
Neutro Abs: 16.8 10*3/uL — ABNORMAL HIGH (ref 1.7–7.7)
Neutrophils Relative %: 92 %
Platelets: 219 10*3/uL (ref 150–400)
RBC: 4.86 MIL/uL (ref 3.87–5.11)
RDW: 18.2 % — ABNORMAL HIGH (ref 11.5–15.5)
Smear Review: UNDETERMINED
WBC: 18.3 10*3/uL — ABNORMAL HIGH (ref 4.0–10.5)
nRBC: 0 % (ref 0.0–0.2)

## 2021-03-27 LAB — POC URINE PREG, ED: Preg Test, Ur: NEGATIVE

## 2021-03-27 LAB — APTT: aPTT: 32 seconds (ref 24–36)

## 2021-03-27 LAB — MAGNESIUM: Magnesium: 1.7 mg/dL (ref 1.7–2.4)

## 2021-03-27 LAB — TSH: TSH: 1.554 u[IU]/mL (ref 0.350–4.500)

## 2021-03-27 LAB — PROTIME-INR
INR: 1.4 — ABNORMAL HIGH (ref 0.8–1.2)
Prothrombin Time: 16.9 seconds — ABNORMAL HIGH (ref 11.4–15.2)

## 2021-03-27 LAB — LIPASE, BLOOD: Lipase: 20 U/L (ref 11–51)

## 2021-03-27 LAB — RESP PANEL BY RT-PCR (FLU A&B, COVID) ARPGX2
Influenza A by PCR: NEGATIVE
Influenza B by PCR: NEGATIVE
SARS Coronavirus 2 by RT PCR: NEGATIVE

## 2021-03-27 LAB — HEPARIN LEVEL (UNFRACTIONATED): Heparin Unfractionated: 0.16 IU/mL — ABNORMAL LOW (ref 0.30–0.70)

## 2021-03-27 MED ORDER — ACETAMINOPHEN 650 MG RE SUPP: 650.0000 mg | Freq: Four times a day (QID) | RECTAL | Status: DC | PRN

## 2021-03-27 MED ORDER — ACETAMINOPHEN 500 MG PO TABS
1000.0000 mg | ORAL_TABLET | Freq: Four times a day (QID) | ORAL | Status: DC | PRN
  Administered 2021-03-28 (×2): 1000 mg via ORAL
  Filled 2021-03-27 (×2): qty 2

## 2021-03-27 MED ORDER — SODIUM CHLORIDE 0.9 % IV SOLN: 1.0000 g | INTRAVENOUS | Status: DC

## 2021-03-27 MED ORDER — ONDANSETRON 4 MG PO TBDP
4.0000 mg | ORAL_TABLET | Freq: Once | ORAL | Status: AC
  Administered 2021-03-27: 4 mg via ORAL
  Filled 2021-03-27: qty 1

## 2021-03-27 MED ORDER — METRONIDAZOLE 500 MG/100ML IV SOLN
500.0000 mg | Freq: Once | INTRAVENOUS | Status: AC
  Administered 2021-03-27: 500 mg via INTRAVENOUS
  Filled 2021-03-27: qty 100

## 2021-03-27 MED ORDER — ONDANSETRON HCL 4 MG/2ML IJ SOLN
4.0000 mg | Freq: Four times a day (QID) | INTRAMUSCULAR | Status: DC | PRN
  Administered 2021-03-28 – 2021-03-29 (×3): 4 mg via INTRAVENOUS
  Filled 2021-03-27 (×3): qty 2

## 2021-03-27 MED ORDER — ONDANSETRON HCL 4 MG/2ML IJ SOLN
4.0000 mg | Freq: Four times a day (QID) | INTRAMUSCULAR | Status: DC | PRN
  Administered 2021-03-27: 4 mg via INTRAVENOUS
  Filled 2021-03-27: qty 2

## 2021-03-27 MED ORDER — LACTATED RINGERS IV BOLUS
1000.0000 mL | Freq: Once | INTRAVENOUS | Status: AC
  Administered 2021-03-27: 1000 mL via INTRAVENOUS

## 2021-03-27 MED ORDER — ACETAMINOPHEN 325 MG PO TABS
650.0000 mg | ORAL_TABLET | Freq: Once | ORAL | Status: AC
  Administered 2021-03-27: 650 mg via ORAL
  Filled 2021-03-27: qty 2

## 2021-03-27 MED ORDER — SODIUM CHLORIDE 0.9 % IV SOLN
1.0000 g | Freq: Once | INTRAVENOUS | Status: AC
  Administered 2021-03-27: 1 g via INTRAVENOUS
  Filled 2021-03-27: qty 10

## 2021-03-27 MED ORDER — FERROUS SULFATE 325 (65 FE) MG PO TABS
325.0000 mg | ORAL_TABLET | Freq: Two times a day (BID) | ORAL | Status: DC
  Administered 2021-03-28 – 2021-03-30 (×5): 325 mg via ORAL
  Filled 2021-03-27 (×5): qty 1

## 2021-03-27 MED ORDER — ONDANSETRON HCL 4 MG PO TABS
4.0000 mg | ORAL_TABLET | Freq: Four times a day (QID) | ORAL | Status: DC | PRN
  Administered 2021-03-27 – 2021-03-28 (×2): 4 mg via ORAL
  Filled 2021-03-27 (×2): qty 1

## 2021-03-27 MED ORDER — METRONIDAZOLE 500 MG/100ML IV SOLN
500.0000 mg | Freq: Three times a day (TID) | INTRAVENOUS | Status: DC
Start: 2021-03-27 — End: 2021-03-28
  Administered 2021-03-27 – 2021-03-28 (×2): 500 mg via INTRAVENOUS
  Filled 2021-03-27 (×4): qty 100

## 2021-03-27 MED ORDER — HEPARIN (PORCINE) 25000 UT/250ML-% IV SOLN
2650.0000 [IU]/h | INTRAVENOUS | Status: DC
  Administered 2021-03-27: 1400 [IU]/h via INTRAVENOUS
  Administered 2021-03-28: 2100 [IU]/h via INTRAVENOUS
  Administered 2021-03-28: 1650 [IU]/h via INTRAVENOUS
  Administered 2021-03-29: 2400 [IU]/h via INTRAVENOUS
  Filled 2021-03-27 (×4): qty 250

## 2021-03-27 MED ORDER — POTASSIUM CITRATE-CITRIC ACID 1100-334 MG/5ML PO SOLN
40.0000 meq | Freq: Once | ORAL | Status: AC
  Administered 2021-03-27: 40 meq via ORAL
  Filled 2021-03-27: qty 20

## 2021-03-27 MED ORDER — IOHEXOL 350 MG/ML SOLN
100.0000 mL | Freq: Once | INTRAVENOUS | Status: AC | PRN
  Administered 2021-03-27: 100 mL via INTRAVENOUS
  Filled 2021-03-27: qty 100

## 2021-03-27 MED ORDER — ACETAMINOPHEN 325 MG PO TABS
650.0000 mg | ORAL_TABLET | Freq: Four times a day (QID) | ORAL | Status: DC | PRN
  Administered 2021-03-27: 650 mg via ORAL
  Filled 2021-03-27: qty 2

## 2021-03-27 MED ORDER — LACTATED RINGERS IV SOLN: INTRAVENOUS | Status: AC

## 2021-03-27 MED ORDER — LABETALOL HCL 5 MG/ML IV SOLN: 5.0000 mg | INTRAVENOUS | Status: DC | PRN

## 2021-03-27 MED ORDER — ONDANSETRON HCL 4 MG PO TABS: 4.0000 mg | ORAL_TABLET | Freq: Four times a day (QID) | ORAL | Status: DC | PRN

## 2021-03-27 MED ORDER — SODIUM CHLORIDE 0.9 % IV SOLN
12.5000 mg | Freq: Once | INTRAVENOUS | Status: AC
  Administered 2021-03-27: 12.5 mg via INTRAVENOUS
  Filled 2021-03-27: qty 12.5

## 2021-03-27 MED ORDER — HEPARIN BOLUS VIA INFUSION
5700.0000 [IU] | Freq: Once | INTRAVENOUS | Status: AC
  Administered 2021-03-27: 5700 [IU] via INTRAVENOUS
  Filled 2021-03-27: qty 5700

## 2021-03-27 MED ORDER — ONDANSETRON HCL 4 MG/2ML IJ SOLN: 4.0000 mg | Freq: Once | INTRAMUSCULAR | Status: DC

## 2021-03-27 MED ORDER — SODIUM CHLORIDE 0.9 % IV SOLN
12.5000 mg | Freq: Four times a day (QID) | INTRAVENOUS | Status: DC | PRN
  Administered 2021-03-27: 12.5 mg via INTRAVENOUS
  Filled 2021-03-27: qty 12.5

## 2021-03-27 NOTE — Progress Notes (Signed)
ANTICOAGULATION CONSULT NOTE  Pharmacy Consult for heparin infusion Indication: ovarian vein thrombosis  No Known Allergies  Patient Measurements:   Heparin Dosing Weight: 81.4 kg  Vital Signs: Temp: 98.4 F (36.9 C) (08/13 1524) Temp Source: Oral (08/13 1524) BP: 105/62 (08/13 1524) Pulse Rate: 91 (08/13 1524)  Labs: Recent Labs    03/26/21 1934 03/27/21 1328  HGB 11.3* 10.6*  HCT 36.4 33.4*  PLT 232 219  CREATININE 0.65 0.89    Estimated Creatinine Clearance: 115.3 mL/min (by C-G formula based on SCr of 0.89 mg/dL).   Medical History: Past Medical History:  Diagnosis Date   Asthma    BMI 40.0-44.9, adult (HCC)    History of blood transfusion 2019   Recurrent UTI     Medications:  Spoke with patient and confirmed she is not taking any anticoagulation  Assessment: 30 yo female  with past medical history of asthma and recurrent UTIs who presented to the ED complaining of nausea and vomiting. Pt also complained of right upper quadrant pain of her abdomen. Pharmacy has been consulted for heparin dosing and monitoring.   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   Goal of Therapy:  Heparin level 0.3-0.7 units/ml Monitor platelets by anticoagulation protocol: Yes   Plan:  Give 5700 units bolus x 1 Start heparin infusion at 1400 units/hr Check anti-Xa level in 6 hours and daily while on heparin Continue to monitor H&H and platelets  Joyell Emami O Sadira Standard 03/27/2021,4:51 PM

## 2021-03-27 NOTE — H&P (Signed)
History and Physical   [name]:[id number] DOB: [birth year] DOA: 03/27/2021  PCP: Patient, No Pcp Per (Inactive)  Outpatient Specialists: Dr. Jean Rosenthal, OBGyn Patient coming from: Home  I have personally briefly reviewed patient's old medical records in [facility] EMR.  Chief Concern: Nausea, vomiting, fever chills  HPI: Rachel Clayton is a 30 y.o. female with medical history significant for history of C-section and successful VBAC, obesity, asthma, history of recurrent UTI, presents the emergency department for chief concerns of persistent nausea, vomiting, fever, chills.  She reports the vomiting, nausea, and chills that started on 03/25/2021.  She reports that she vomited at least 3 x at home.  She presented initially to the emergency department on 03/26/21, however left before being seen due to the prolonged wait.    She reports that she has been trying to drink water, orange juice without improvement.  She reports the vomitus is orange and white and yellow and denies black or red emesis. She denies sick contacts, changes to medications.   She had 3 double shots of Hennessy whiskey on Wednesday night. She drinks every weekend.  She denies history of DTs, tremors, seizures when she does not have alcohol.  She endorses headache that started three days, throbbing and band-like around her head. She tried ibuprofen without improvement. She denies double vision, lightening, thunderclap, vision changes, tears to her eyes.  She reports that Tylenol given in the emergency department improved her headaches.  Social history: She lives with her kids at home.  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  She denies using tobacco products, recreational drug use.  She drinks alcohol every weekend.  Vaccination history: She is not vaccinated for COVID-19  ROS: Constitutional: no weight change, + fever ENT/Mouth: no sore throat, no rhinorrhea Eyes: no eye pain, no vision changes Cardiovascular: no chest  pain, no dyspnea,  no edema, no palpitations Respiratory: no cough, no sputum, no wheezing Gastrointestinal: + nausea, + vomiting, no diarrhea, no constipation Genitourinary: no urinary incontinence, no dysuria, no hematuria Musculoskeletal: no arthralgias, no myalgias Skin: no skin lesions, no pruritus, Neuro: + weakness, no loss of consciousness, no syncope Psych: no anxiety, no depression, + decrease appetite Heme/Lymph: no bruising, no bleeding  ED Course: Discussed with emergency medicine provider, patient requiring hospitalization for meeting sepsis criteria.  Vitals in the emergency department was remarkable for temperature 101, respiration rate of 20, heart rate of 112, blood pressure 155/82, SPO2 of 100% on room air.  Labs in the emergency department was remarkable for sodium 134, potassium 3.1, chloride 103, bicarb 22, BUN 10, serum creatinine of 0.89, nonfasting blood glucose 130, lactic acid 1.1, WBC 18.3, hemoglobin 10.6, platelets 219.  UA showed large leukocytes.  ED provider ordered lactated ringer 2 L bolus, ceftriaxone 1 g, metronidazole, ondansetron 4 mg p.o., Tylenol 650 mg p.o., Phenergan IV.  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  For SBP greater than 160, 4 doses ordered  # Persistent nausea and  vomiting-symptomatic control with Zofran p.o. every 6 hours.  For nausea, Zofran IV every 6 hours as needed for vomiting, Phenergan 12.5 mg IV every 6 hours prn for  refractory nausea and vomiting, 3 doses ordered  # Obesity # Hirsutism - Outpatient follow-up  Chart reviewed.   DVT prophylaxis: Heparin GTT Code Status: Full code Diet: Regular diet Family Communication: Updated boyfriend at bedside per patient's permission Disposition Plan: Pending clinical course Consults called: OB/GYN Admission status: MedSurg, observation, telemetry  Past Medical History:  Diagnosis Date   Asthma    BMI 40.0-44.9, adult (HCC)    History of blood transfusion 2019   Recurrent UTI    Past Surgical History:  Procedure Laterality Date   CESAREAN SECTION     HAND SURGERY     Social History:  reports that she has never smoked. She has never used smokeless tobacco. She reports current alcohol use of about 2.0 standard drinks per week. She reports current drug use. Drug: Marijuana.  No Known Allergies Family History  Problem Relation Age of Onset   Hypertension Mother    Family history: Family history reviewed and not pertinent  Prior to Admission medications   Medication Sig Start Date End Date Taking? Authorizing Provider  brompheniramine-pseudoephedrine-DM 30-2-10 MG/5ML syrup Take 5 mLs by mouth 4 (four) times daily as needed. Patient not taking: No sig reported 01/12/21   Joni ReiningSmith, Ronald K, PA-C  ferrous sulfate (FERROUSUL) 325 (65 FE) MG tablet Take 1 tablet (325 mg total) by mouth 2 (two) times daily with a meal. 02/11/21   Federico FlakeNewton, Kimberly Niles, MD  ibuprofen (ADVIL) 600 MG tablet Take 1 tablet (600 mg total) by mouth every 6 (six) hours as needed. Patient not taking: No sig reported 03/02/20   Enid DerryWagner, Ashley, PA-C  ibuprofen (ADVIL) 800 MG tablet Take 1 tablet (800 mg total) by mouth every 8 (eight) hours as needed for moderate pain. Patient not taking: No sig reported 01/12/21   Joni ReiningSmith, Ronald K, PA-C  Norethin Ace-Eth Estrad-FE (TAYTULLA) 1-20 MG-MCG(24) CAPS Take 1 capsule by mouth daily. 08/02/18 11/26/18  Conard NovakJackson, Stephen D, MD   norgestimate-ethinyl estradiol (ORTHO-CYCLEN,SPRINTEC,PREVIFEM) 0.25-35 MG-MCG tablet Take 1 tablet by mouth 3 (three) times daily for 7 days. 08/09/18 08/16/18  Conard NovakJackson, Stephen D, MD  predniSONE (DELTASONE) 10 MG tablet Take 6 tablets on day 1, take 5 tablets on day 2, take 4 tablets on day 3, take 3 tablets on day 4, take 2 tablets on day 5, take 1 tablet on day 6 Patient not taking: No sig reported 03/02/20   Enid DerryWagner, Ashley, PA-C   Physical Exam: Vitals:   03/27/21 1524 03/27/21 1723 03/27/21 1830 03/27/21 1930  BP: 105/62 (!) 147/94  (!) 151/88  Pulse: 91 (!) 132 (!) 126 (!) 124  Resp: 16 18  15   Temp: 98.4 F (36.9 C) 99.9 F (37.7 C)  99.7 F (37.6 C)  TempSrc: Oral Oral  Oral  SpO2: 99% 100% 100% 100%   Constitutional: appears older than chronological age, NAD, calm, comfortable Eyes: PERRL, lids and conjunctivae normal ENMT: Mucous membranes are moist. Posterior pharynx clear of any exudate or lesions. Age-appropriate dentition. Hearing appropriate Neck: normal, supple, no masses, no thyromegaly Respiratory: clear to auscultation bilaterally, no wheezing, no crackles. Normal respiratory effort. No accessory muscle use.  Cardiovascular: Regular rate and rhythm, no murmurs / rubs / gallops. No extremity edema. 2+ pedal pulses. No carotid bruits.  Abdomen: Obese abdomen, with striation, hirsutism present, diffuse tenderness, no masses palpated, no  hepatosplenomegaly. Bowel sounds positive.  Musculoskeletal: no clubbing / cyanosis. No joint deformity upper and lower extremities. Good ROM, no contractures, no atrophy. Normal muscle tone.  Skin: no rashes, lesions, ulcers. No induration Neurologic: Sensation intact. Strength 5/5 in all 4.  Psychiatric: Normal judgment and insight. Alert and oriented x 3. Normal mood.   EKG: independently reviewed, showing Sinus tachycardia with rate of 134, QTc 430  Chest x-ray on Admission: I personally reviewed and I agree with radiologist reading  as below.  DG Chest 2 View  Result Date: 03/27/2021 CLINICAL DATA:  30 year old female with fever EXAM: CHEST - 2 VIEW COMPARISON:  01/12/2021 FINDINGS: Cardiomediastinal silhouette unchanged in size and contour. No evidence of central vascular congestion. No interlobular septal thickening. No pneumothorax or pleural effusion. Coarsened interstitial markings, with no confluent airspace disease. No acute displaced fracture. Degenerative changes of the spine. IMPRESSION: No active cardiopulmonary disease. Electronically Signed   By: Gilmer Mor D.O.   On: 03/27/2021 13:09   CT Abdomen Pelvis W Contrast  Result Date: 03/27/2021 CLINICAL DATA:  Abdominal abscess/infection suspected Fever and vomiting. EXAM: CT ABDOMEN AND PELVIS WITH CONTRAST TECHNIQUE: Multidetector CT imaging of the abdomen and pelvis was performed using the standard protocol following bolus administration of intravenous contrast. CONTRAST:  OMNIPAQUE IOHEXOL 350 MG/ML SOLN COMPARISON:  Abdominal ultrasound earlier today. FINDINGS: Lower chest: The lung bases are clear. No acute airspace disease or pleural effusion. Hepatobiliary: Diffusely decreased hepatic density consistent with steatosis. Slightly more focal fatty infiltration adjacent to the falciform ligament. No suspicious liver lesion. Gallbladder physiologically distended, no calcified stone. No biliary dilatation. Pancreas: No ductal dilatation or inflammation. Spleen: Borderline enlarged spanning 13.6 x 12.6 x 5.1 cm (volume = 460 cm^3). Splenule posteriorly but no focal lesion. Adrenals/Urinary Tract: No adrenal nodule. Heterogeneous left renal enhancement with perinephric edema. No visualized renal calculi. 12 x 16 mm hypodense lesion arising from the upper left kidney is nonspecific. This does not have peripheral enhancement or thick walls. There is slight right renal heterogeneity and equivocal perinephric edema. Urinary bladder is minimally distended no definite bladder  wall thickening or perivesicular fat stranding. Stomach/Bowel: Decompressed stomach. No small bowel obstruction or inflammation. Normal appendix. There is scattered colonic diverticulosis. No diverticulitis or acute colonic inflammation. Vascular/Lymphatic: Normal caliber abdominal aorta. The right ovarian vein is expanded and contains intraluminal low densities suspicious for ovarian vein thrombus, for example series 2 images 52 through 62. There is questionable low-density involving the left ovarian vein which is ill-defined, for example series 2, image 45. There multiple small retroperitoneal lymph nodes that are not enlarged by size criteria. No enlarged lymph nodes in the abdomen or pelvis. Reproductive: Anteverted uterus. The ovaries are symmetric in size. Probable corpus luteal cyst in the right ovary. Other: No free air, free fluid, or intra-abdominal fluid collection. Musculoskeletal: There are no acute or suspicious osseous abnormalities. IMPRESSION: 1. Heterogeneous left renal enhancement with perinephric edema, suspicious for pyelonephritis. There is also mild heterogeneous right renal enhancement which may represent pyelonephritis. 2. Indeterminate 12 x 16 mm hypodense lesion arising from the upper left kidney. This does not have peripheral enhancement or thick wall to suggest abscess. A 9 mm lesion with seen on prior 11/24/2016 abdominal MRI recommend. Recommend follow-up renal protocol MRI for characterization after resolution of acute symptoms. 3. Findings consistent with right and possibly left ovarian vein thrombosis. There is mild edema adjacent to the left ovarian vein. While this may be reactive related to retroperitoneal inflammation, etiology is  indeterminate. 4. Hepatic steatosis. Borderline splenomegaly. 5. Colonic diverticulosis without diverticulitis. These results were called by telephone at the time of interpretation on 03/27/2021 at 4:28 pm to provider [facility] , who verbally  acknowledged these results. Electronically Signed   By: Narda Rutherford M.D.   On: 03/27/2021 16:29   US Abdomen Limited RUQ (LIVER/GB)  Result Date: 03/27/2021 CLINICAL DATA:  RIGHT upper quadrant pain, nausea and vomiting for 4 days. EXAM: ULTRASOUND ABDOMEN LIMITED RIGHT UPPER QUADRANT COMPARISON:  None. FINDINGS: Gallbladder: No gallstones or wall thickening visualized. No sonographic Murphy sign noted by sonographer. Common bile duct: Diameter: 1 mm Liver: No focal lesion identified. Liver is diffusely echogenic suggesting fatty infiltration. Portal vein is patent on color Doppler imaging with normal direction of blood flow towards the liver. Other: None. IMPRESSION: 1. No acute findings. Gallbladder appears normal. No bile duct dilatation. 2. Probable fatty infiltration of the liver. Electronically Signed   By: Bary Richard M.D.   On: 03/27/2021 14:11    Labs on Admission: I have personally reviewed following labs  CBC: Recent Labs  Lab 03/26/21 1934 03/27/21 1328  WBC 16.2* 18.3*  NEUTROABS  --  16.8*  HGB 11.3* 10.6*  HCT 36.4 33.4*  MCV 67.8* 68.7*  PLT 232 219   Basic Metabolic Panel: Recent Labs  Lab 03/26/21 1934 03/27/21 1328  NA 131* 134*  K 3.4* 3.1*  CL 100 103  CO2 21* 22  GLUCOSE 135* 130*  BUN 8 10  CREATININE 0.65 0.89  CALCIUM 8.9 8.7*   GFR: Estimated Creatinine Clearance: 115.3 mL/min (by C-G formula based on SCr of 0.89 mg/dL).  Liver Function Tests: Recent Labs  Lab 03/26/21 1934 03/27/21 1328  AST 19 20  ALT 18 21  ALKPHOS 57 59  BILITOT 1.3* 1.6*  PROT 8.0 7.9  ALBUMIN 4.0 3.6   Recent Labs  Lab 03/26/21 1934 03/27/21 1328  LIPASE 23 20   Urine analysis:    Component Value Date/Time   COLORURINE YELLOW (A) 03/27/2021 1508   APPEARANCEUR CLOUDY (A) 03/27/2021 1508   APPEARANCEUR Clear 06/26/2014 1204   LABSPEC 1.003 (L) 03/27/2021 1508   LABSPEC 1.014 06/26/2014 1204   PHURINE 7.0 03/27/2021 1508   GLUCOSEU NEGATIVE  03/27/2021 1508   GLUCOSEU Negative 06/26/2014 1204   HGBUR MODERATE (A) 03/27/2021 1508   BILIRUBINUR NEGATIVE 03/27/2021 1508   BILIRUBINUR Negative 06/26/2014 1204   KETONESUR 5 (A) 03/27/2021 1508   PROTEINUR 30 (A) 03/27/2021 1508   NITRITE NEGATIVE 03/27/2021 1508   LEUKOCYTESUR LARGE (A) 03/27/2021 1508   LEUKOCYTESUR Negative 06/26/2014 1204   Dr. Sedalia Muta [facility]  If 7PM-7AM, please contact overnight-coverage provider If 7AM-7PM, please contact day coverage provider [email or web address]  03/27/2021, 8:15 PM

## 2021-03-27 NOTE — Consult Note (Signed)
Obstetrics & Gynecology Consult H&P   Consulting Department: Emergency department  Consulting Physician: Chesley NoonJessup, Charles MD  Consulting Question:Ovarian vein thrombosis   History of Present Illness: Patient is a 30 y.o. [phone] presenting to the emergency department with pyelonephritis with 4 days of symptoms per patient.  She reports left back and flank pain, fevers, and chills.  Has also experienced nausea.  The patient underwent CT imaging showing right, possible left ovarian vein thrombosis.  Most of her pain is left flank.  Left perinephric edema suspicious for pyelonephritis.  LMP 02/23/2021, she has been prescribed OCP in the past but reports she has not been taking OCP.  No vaginal discharge.  There is no family history of DVT/PE.    Review of Systems:10 point review of systems  Past Medical History:  Patient Active Problem List   Diagnosis Date Noted   Obesity BMI=42.3 02/08/2021   Elevated blood pressure reading 138/93 on 02/08/21 02/08/2021    Referred to primary care MD    Hirsutism 02/08/2021   Asthma dx'd age 30 02/08/2021   Trichomonas infection 02/08/2021   Acute blood loss anemia 07/22/2018   Menorrhagia with irregular cycle 07/22/2018   Anemia associated with acute blood loss 07/22/2018    Past Surgical History:  Past Surgical History:  Procedure Laterality Date   CESAREAN SECTION     HAND SURGERY      Gynecologic History:   Obstetric History: [id number]  Family History:  Family History  Problem Relation Age of Onset   Hypertension Mother     Social History:  Social History   Socioeconomic History   Marital status: Single    Spouse name: Not on file   Number of children: 4   Years of education: 8   Highest education level: 8th grade  Occupational History   Not on file  Tobacco Use   Smoking status: Never   Smokeless tobacco: Never  Vaping Use   Vaping Use: Never used  Substance and Sexual Activity   Alcohol use: Yes    Alcohol/week: 2.0  standard drinks    Types: 2 Shots of liquor per week    Comment: 3-4x/mo   Drug use: Yes    Types: Marijuana    Comment: last use 12/2020   Sexual activity: Yes    Partners: Male    Birth control/protection: Condom  Other Topics Concern   Not on file  Social History Narrative   Not on file   Social Determinants of Health   Financial Resource Strain: Not on file  Food Insecurity: Not on file  Transportation Needs: Not on file  Physical Activity: Not on file  Stress: Not on file  Social Connections: Not on file  Intimate Partner Violence: Not on file    Allergies:  No Known Allergies  Medications: Prior to Admission medications   Medication Sig Start Date End Date Taking? Authorizing Provider  brompheniramine-pseudoephedrine-DM 30-2-10 MG/5ML syrup Take 5 mLs by mouth 4 (four) times daily as needed. Patient not taking: No sig reported 01/12/21   Joni ReiningSmith, Ronald K, PA-C  ferrous sulfate (FERROUSUL) 325 (65 FE) MG tablet Take 1 tablet (325 mg total) by mouth 2 (two) times daily with a meal. 02/11/21   Federico FlakeNewton, Kimberly Niles, MD  ibuprofen (ADVIL) 600 MG tablet Take 1 tablet (600 mg total) by mouth every 6 (six) hours as needed. Patient not taking: No sig reported 03/02/20   Enid DerryWagner, Ashley, PA-C  ibuprofen (ADVIL) 800 MG tablet Take 1 tablet (800 mg  total) by mouth every 8 (eight) hours as needed for moderate pain. Patient not taking: No sig reported 01/12/21   Joni Reining, PA-C  Norethin Ace-Eth Estrad-FE (TAYTULLA) 1-20 MG-MCG(24) CAPS Take 1 capsule by mouth daily. 08/02/18 11/26/18  Conard Novak, MD  norgestimate-ethinyl estradiol (ORTHO-CYCLEN,SPRINTEC,PREVIFEM) 0.25-35 MG-MCG tablet Take 1 tablet by mouth 3 (three) times daily for 7 days. 08/09/18 08/16/18  Conard Novak, MD  predniSONE (DELTASONE) 10 MG tablet Take 6 tablets on day 1, take 5 tablets on day 2, take 4 tablets on day 3, take 3 tablets on day 4, take 2 tablets on day 5, take 1 tablet on day 6 Patient not  taking: No sig reported 03/02/20   Enid Derry, PA-C    Physical Exam Vitals: Blood pressure 105/62, pulse 91, temperature 98.4 F (36.9 C), temperature source Oral, resp. rate 16, last menstrual period 02/28/2021, SpO2 99 %, unknown if currently breastfeeding. General: In visible discomfort, rigors HEENT: normocephalic, anicteric Pulmonary: No increased work of breathing Abdomen: soft, non-distended, diffuesely tender L>R Extremities: no edema, erythema, or tenderness Neurologic: Grossly intact Psychiatric: mood appropriate, affect full  Labs: Results for orders placed or performed during the hospital encounter of 03/27/21 (from the past 72 hour(s))  CBC with Differential     Status: Abnormal   Collection Time: 03/27/21  1:28 PM  Result Value Ref Range   WBC 18.3 (H) 4.0 - 10.5 K/uL   RBC 4.86 3.87 - 5.11 MIL/uL   Hemoglobin 10.6 (L) 12.0 - 15.0 g/dL   HCT 63.0 (L) 16.0 - 10.9 %   MCV 68.7 (L) 80.0 - 100.0 fL   MCH 21.8 (L) 26.0 - 34.0 pg   MCHC 31.7 30.0 - 36.0 g/dL   RDW 32.3 (H) 55.7 - 32.2 %   Platelets 219 150 - 400 K/uL   nRBC 0.0 0.0 - 0.2 %   Neutrophils Relative % 92 %   Neutro Abs 16.8 (H) 1.7 - 7.7 K/uL   Lymphocytes Relative 2 %   Lymphs Abs 0.4 (L) 0.7 - 4.0 K/uL   Monocytes Relative 5 %   Monocytes Absolute 0.9 0.1 - 1.0 K/uL   Eosinophils Relative 0 %   Eosinophils Absolute 0.0 0.0 - 0.5 K/uL   Basophils Relative 0 %   Basophils Absolute 0.1 0.0 - 0.1 K/uL   WBC Morphology MORPHOLOGY UNREMARKABLE    RBC Morphology MORPHOLOGY UNREMARKABLE    Smear Review PLATELET CLUMPS NOTED ON SMEAR, UNABLE TO ESTIMATE    Immature Granulocytes 1 %   Abs Immature Granulocytes 0.14 (H) 0.00 - 0.07 K/uL    Comment: Performed at [facility], [address] [place], [place] [address]  Comprehensive metabolic panel     Status: Abnormal   Collection Time: 03/27/21  1:28 PM  Result Value Ref Range   Sodium 134 (L) 135 - 145 mmol/L   Potassium 3.1 (L) 3.5 - 5.1  mmol/L   Chloride 103 98 - 111 mmol/L   CO2 22 22 - 32 mmol/L   Glucose, Bld 130 (H) 70 - 99 mg/dL    Comment: Glucose reference range applies only to samples taken after fasting for at least 8 hours.   BUN 10 6 - 20 mg/dL   Creatinine, Ser 7.06 0.44 - 1.00 mg/dL   Calcium 8.7 (L) 8.9 - 10.3 mg/dL   Total Protein 7.9 6.5 - 8.1 g/dL   Albumin 3.6 3.5 - 5.0 g/dL   AST 20 15 - 41 U/L  ALT 21 0 - 44 U/L   Alkaline Phosphatase 59 38 - 126 U/L   Total Bilirubin 1.6 (H) 0.3 - 1.2 mg/dL   GFR, Estimated >90 >24 mL/min    Comment: (NOTE) Calculated using the CKD-EPI Creatinine Equation (2021)    Anion gap 9 5 - 15    Comment: Performed at [facility], [address] [place], [place] [address]  Lipase, blood     Status: None   Collection Time: 03/27/21  1:28 PM  Result Value Ref Range   Lipase 20 11 - 51 U/L    Comment: Performed at [facility], [address], [place], [place] [address]  Resp Panel by RT-PCR (Flu A&B, Covid) Nasopharyngeal Swab     Status: None   Collection Time: 03/27/21  1:28 PM   Specimen: Nasopharyngeal Swab; Nasopharyngeal(NP) swabs in vial transport medium  Result Value Ref Range   SARS Coronavirus 2 by RT PCR NEGATIVE NEGATIVE    Comment: (NOTE) SARS-CoV-2 target nucleic acids are NOT DETECTED.  The SARS-CoV-2 RNA is generally detectable in upper respiratory specimens during the acute phase of infection. The lowest concentration of SARS-CoV-2 viral copies this assay can detect is 138 copies/mL. A negative result does not preclude SARS-Cov-2 infection and should not be used as the sole basis for treatment or other patient management decisions. A negative result may occur with  improper specimen collection/handling, submission of specimen other than nasopharyngeal swab, presence of viral mutation(s) within the areas targeted by this assay, and inadequate number of viral copies(<138 copies/mL). A negative result must be combined  with clinical observations, patient history, and epidemiological information. The expected result is Negative.  Fact Sheet for Patients:  BloggerCourse.com  Fact Sheet for Healthcare Providers:  SeriousBroker.it  This test is no t yet approved or cleared by the [place] FDA and  has been authorized for detection and/or diagnosis of SARS-CoV-2 by FDA under an Emergency Use Authorization (EUA). This EUA will remain  in effect (meaning this test can be used) for the duration of the COVID-19 declaration under Section 564(b)(1) of the Act, 21 U.S.C.section 360bbb-3(b)(1), unless the authorization is terminated  or revoked sooner.       Influenza A by PCR NEGATIVE NEGATIVE   Influenza B by PCR NEGATIVE NEGATIVE    Comment: (NOTE) The Xpert Xpress SARS-CoV-2/FLU/RSV plus assay is intended as an aid in the diagnosis of influenza from Nasopharyngeal swab specimens and should not be used as a sole basis for treatment. Nasal washings and aspirates are unacceptable for Xpert Xpress SARS-CoV-2/FLU/RSV testing.  Fact Sheet for Patients: BloggerCourse.com  Fact Sheet for Healthcare Providers: SeriousBroker.it  This test is not yet approved or cleared by the [place] FDA and has been authorized for detection and/or diagnosis of SARS-CoV-2 by FDA under an Emergency Use Authorization (EUA). This EUA will remain in effect (meaning this test can be used) for the duration of the COVID-19 declaration under Section 564(b)(1) of the Act, 21 U.S.C. section 360bbb-3(b)(1), unless the authorization is terminated or revoked.  Performed at [facility], [address] [place], [place] [address]   Urinalysis, Complete w Microscopic     Status: Abnormal   Collection Time: 03/27/21  3:08 PM  Result Value Ref Range   Color, Urine YELLOW (A) YELLOW   APPearance CLOUDY (A) CLEAR    Specific Gravity, Urine 1.003 (L) 1.005 - 1.030   pH 7.0 5.0 - 8.0   Glucose, UA NEGATIVE NEGATIVE mg/dL  Hgb urine dipstick MODERATE (A) NEGATIVE   Bilirubin Urine NEGATIVE NEGATIVE   Ketones, ur 5 (A) NEGATIVE mg/dL   Protein, ur 30 (A) NEGATIVE mg/dL   Nitrite NEGATIVE NEGATIVE   Leukocytes,Ua LARGE (A) NEGATIVE   RBC / HPF 0-5 0 - 5 RBC/hpf   WBC, UA >50 (H) 0 - 5 WBC/hpf   Bacteria, UA MANY (A) NONE SEEN   Squamous Epithelial / LPF 0-5 0 - 5   Mucus PRESENT     Comment: Performed at [facility], [address] [place], [place] [address]  Lactic acid, plasma     Status: None   Collection Time: 03/27/21  3:08 PM  Result Value Ref Range   Lactic Acid, Venous 1.1 0.5 - 1.9 mmol/L    Comment: Performed at [facility], [address] [place], [place] [address]  POC urine preg, ED     Status: None   Collection Time: 03/27/21  3:21 PM  Result Value Ref Range   Preg Test, Ur Negative Negative    Imaging DG Chest 2 View  Result Date: 03/27/2021 CLINICAL DATA:  30 year old female with fever EXAM: CHEST - 2 VIEW COMPARISON:  01/12/2021 FINDINGS: Cardiomediastinal silhouette unchanged in size and contour. No evidence of central vascular congestion. No interlobular septal thickening. No pneumothorax or pleural effusion. Coarsened interstitial markings, with no confluent airspace disease. No acute displaced fracture. Degenerative changes of the spine. IMPRESSION: No active cardiopulmonary disease. Electronically Signed   By: Gilmer Mor D.O.   On: 03/27/2021 13:09   CT Abdomen Pelvis W Contrast  Result Date: 03/27/2021 CLINICAL DATA:  Abdominal abscess/infection suspected Fever and vomiting. EXAM: CT ABDOMEN AND PELVIS WITH CONTRAST TECHNIQUE: Multidetector CT imaging of the abdomen and pelvis was performed using the standard protocol following bolus administration of intravenous contrast. CONTRAST:  OMNIPAQUE IOHEXOL 350 MG/ML SOLN COMPARISON:  Abdominal  ultrasound earlier today. FINDINGS: Lower chest: The lung bases are clear. No acute airspace disease or pleural effusion. Hepatobiliary: Diffusely decreased hepatic density consistent with steatosis. Slightly more focal fatty infiltration adjacent to the falciform ligament. No suspicious liver lesion. Gallbladder physiologically distended, no calcified stone. No biliary dilatation. Pancreas: No ductal dilatation or inflammation. Spleen: Borderline enlarged spanning 13.6 x 12.6 x 5.1 cm (volume = 460 cm^3). Splenule posteriorly but no focal lesion. Adrenals/Urinary Tract: No adrenal nodule. Heterogeneous left renal enhancement with perinephric edema. No visualized renal calculi. 12 x 16 mm hypodense lesion arising from the upper left kidney is nonspecific. This does not have peripheral enhancement or thick walls. There is slight right renal heterogeneity and equivocal perinephric edema. Urinary bladder is minimally distended no definite bladder wall thickening or perivesicular fat stranding. Stomach/Bowel: Decompressed stomach. No small bowel obstruction or inflammation. Normal appendix. There is scattered colonic diverticulosis. No diverticulitis or acute colonic inflammation. Vascular/Lymphatic: Normal caliber abdominal aorta. The right ovarian vein is expanded and contains intraluminal low densities suspicious for ovarian vein thrombus, for example series 2 images 52 through 62. There is questionable low-density involving the left ovarian vein which is ill-defined, for example series 2, image 45. There multiple small retroperitoneal lymph nodes that are not enlarged by size criteria. No enlarged lymph nodes in the abdomen or pelvis. Reproductive: Anteverted uterus. The ovaries are symmetric in size. Probable corpus luteal cyst in the right ovary. Other: No free air, free fluid, or intra-abdominal fluid collection. Musculoskeletal: There are no acute or suspicious osseous abnormalities. IMPRESSION: 1. Heterogeneous  left renal enhancement with perinephric edema, suspicious  for pyelonephritis. There is also mild heterogeneous right renal enhancement which may represent pyelonephritis. 2. Indeterminate 12 x 16 mm hypodense lesion arising from the upper left kidney. This does not have peripheral enhancement or thick wall to suggest abscess. A 9 mm lesion with seen on prior 11/24/2016 abdominal MRI recommend. Recommend follow-up renal protocol MRI for characterization after resolution of acute symptoms. 3. Findings consistent with right and possibly left ovarian vein thrombosis. There is mild edema adjacent to the left ovarian vein. While this may be reactive related to retroperitoneal inflammation, etiology is indeterminate. 4. Hepatic steatosis. Borderline splenomegaly. 5. Colonic diverticulosis without diverticulitis. These results were called by telephone at the time of interpretation on 03/27/2021 at 4:28 pm to provider [facility] , who verbally acknowledged these results. Electronically Signed   By: Narda Rutherford M.D.   On: 03/27/2021 16:29   US Abdomen Limited RUQ (LIVER/GB)  Result Date: 03/27/2021 CLINICAL DATA:  RIGHT upper quadrant pain, nausea and vomiting for 4 days. EXAM: ULTRASOUND ABDOMEN LIMITED RIGHT UPPER QUADRANT COMPARISON:  None. FINDINGS: Gallbladder: No gallstones or wall thickening visualized. No sonographic Murphy sign noted by sonographer. Common bile duct: Diameter: 1 mm Liver: No focal lesion identified. Liver is diffusely echogenic suggesting fatty infiltration. Portal vein is patent on color Doppler imaging with normal direction of blood flow towards the liver. Other: None. IMPRESSION: 1. No acute findings. Gallbladder appears normal. No bile duct dilatation. 2. Probable fatty infiltration of the liver. Electronically Signed   By: Bary Richard M.D.   On: 03/27/2021 14:11    Assessment: 30 y.o. [id number] with right possible left ovarian vein thrombosis in the setting of  pyelonephritis  Plan:  1) Septic ovarian vein thrombosis - this is classically a postpartum finding secondary to compressive effects of the gravid uterus and some sort of peripartum infection such as chorioamnionitis or endometritis.  The patient is not postpartum but does have an active infection GU infection. - GC/CT cultures - would treat the same as pregnancy associated septic thrombophlebitis with ceftriaxone 1g every 24-hrs and flagyl  every 8-hrs.   - anticoagulation (heparin drip per vascular but may use lovenox on discharge)  2) Pyelonephritis/Sepsis - per medicine team  Vena Austria, MD, [facility], [facility] 03/27/2021, 5:09 PM

## 2021-03-27 NOTE — ED Triage Notes (Signed)
Pt comes with c/o fever, sweats and vomiting. Pt states she was seen here last night but then left. Pt states she doesn't feel any better.

## 2021-03-27 NOTE — ED Provider Notes (Addendum)
[facility] Emergency Department Provider Note   ____________________________________________   Event Date/Time   First MD Initiated Contact with Patient 03/27/21 1210     (approximate)  I have reviewed the triage vital signs and the nursing notes.   HISTORY  Chief Complaint Emesis    HPI Rachel Clayton is a 30 y.o. female with past medical history of asthma who presents to the ED complaining of nausea and vomiting.  Patient reports that for the past 3 to 4 days she has been dealing with consistent nausea and vomiting to the point that she is having a hard time keeping down either liquids or solids.  She denies any associated diarrhea, does state that she has been dealing with pain in the right upper quadrant of her abdomen.  She complains of mild difficulty breathing, has felt hot at home, but denies any chest pain or cough.  She denies any dysuria or flank pain.  She has not had any recent sick contacts, does states she has not been vaccinated against COVID-19.        Past Medical History:  Diagnosis Date   Asthma    BMI 40.0-44.9, adult (HCC)    History of blood transfusion 2019   Recurrent UTI     Patient Active Problem List   Diagnosis Date Noted   Sepsis (HCC) 03/27/2021   Obesity BMI=42.3 02/08/2021   Elevated blood pressure reading 138/93 on 02/08/21 02/08/2021   Hirsutism 02/08/2021   Asthma dx'd age 60 02/08/2021   Trichomonas infection 02/08/2021   Acute blood loss anemia 07/22/2018   Menorrhagia with irregular cycle 07/22/2018   Anemia associated with acute blood loss 07/22/2018    Past Surgical History:  Procedure Laterality Date   CESAREAN SECTION     HAND SURGERY      Prior to Admission medications   Medication Sig Start Date End Date Taking? Authorizing Provider  ferrous sulfate (FERROUSUL) 325 (65 FE) MG tablet Take 1 tablet (325 mg total) by mouth 2 (two) times daily with a meal. 02/11/21  Yes Federico Flake, MD   ibuprofen (ADVIL) 200 MG tablet Take 400-600 mg by mouth every 6 (six) hours as needed for mild pain.   Yes [provider]    Allergies Patient has no known allergies.  Family History  Problem Relation Age of Onset   Hypertension Mother     Social History Social History   Tobacco Use   Smoking status: Never   Smokeless tobacco: Never  Vaping Use   Vaping Use: Never used  Substance Use Topics   Alcohol use: Yes    Alcohol/week: 2.0 standard drinks    Types: 2 Shots of liquor per week    Comment: 3-4x/mo   Drug use: Yes    Types: Marijuana    Comment: last use 12/2020    Review of Systems  Constitutional: Positive for fever/chills Eyes: No visual changes. ENT: No sore throat. Cardiovascular: Denies chest pain. Respiratory: Negative for cough, positive for shortness of breath. Gastrointestinal: Positive for abdominal pain, nausea, and vomiting.  No diarrhea.  No constipation. Genitourinary: Negative for dysuria. Musculoskeletal: Negative for back pain. Skin: Negative for rash. Neurological: Negative for headaches, focal weakness or numbness.  ____________________________________________   PHYSICAL EXAM:  VITAL SIGNS: ED Triage Vitals  Enc Vitals Group     BP 03/27/21 1121 (!) 155/82     Pulse Rate 03/27/21 1121 (!) 112     Resp 03/27/21 1121 20  Temp 03/27/21 1121 (!) 101 F (38.3 C)     Temp Source 03/27/21 1121 Oral     SpO2 03/27/21 1121 100 %     Weight --      Height --      Head Circumference --      Peak Flow --      Pain Score 03/27/21 1113 3     Pain Loc --      Pain Edu? --      Excl. in GC? --     Constitutional: Alert and oriented. Eyes: Conjunctivae are normal. Head: Atraumatic. Nose: No congestion/rhinnorhea. Mouth/Throat: Mucous membranes are moist. Neck: Normal ROM Cardiovascular: Normal rate, regular rhythm. Grossly normal heart sounds. Respiratory: Normal respiratory effort.  No retractions. Lungs  CTAB. Gastrointestinal: Soft and tender to palpation in the right upper quadrant with no rebound or guarding.  No CVA tenderness bilaterally. No distention. Genitourinary: deferred Musculoskeletal: No lower extremity tenderness nor edema. Neurologic:  Normal speech and language. No gross focal neurologic deficits are appreciated. Skin:  Skin is warm, dry and intact. No rash noted. Psychiatric: Mood and affect are normal. Speech and behavior are normal.  ____________________________________________   LABS (all labs ordered are listed, but only abnormal results are displayed)  Labs Reviewed  CBC WITH DIFFERENTIAL/PLATELET - Abnormal; Notable for the following components:      Result Value   WBC 18.3 (*)    Hemoglobin 10.6 (*)    HCT 33.4 (*)    MCV 68.7 (*)    MCH 21.8 (*)    RDW 18.2 (*)    Neutro Abs 16.8 (*)    Lymphs Abs 0.4 (*)    Abs Immature Granulocytes 0.14 (*)    All other components within normal limits  COMPREHENSIVE METABOLIC PANEL - Abnormal; Notable for the following components:   Sodium 134 (*)    Potassium 3.1 (*)    Glucose, Bld 130 (*)    Calcium 8.7 (*)    Total Bilirubin 1.6 (*)    All other components within normal limits  URINALYSIS, COMPLETE (UACMP) WITH MICROSCOPIC - Abnormal; Notable for the following components:   Color, Urine YELLOW (*)    APPearance CLOUDY (*)    Specific Gravity, Urine 1.003 (*)    Hgb urine dipstick MODERATE (*)    Ketones, ur 5 (*)    Protein, ur 30 (*)    Leukocytes,Ua LARGE (*)    WBC, UA >50 (*)    Bacteria, UA MANY (*)    All other components within normal limits  PROTIME-INR - Abnormal; Notable for the following components:   Prothrombin Time 16.9 (*)    INR 1.4 (*)    All other components within normal limits  RESP PANEL BY RT-PCR (FLU A&B, COVID) ARPGX2  CULTURE, BLOOD (ROUTINE X 2)  CULTURE, BLOOD (ROUTINE X 2)  URINE CULTURE  CHLAMYDIA/NGC RT PCR ([facility] ONLY)            LIPASE, BLOOD  LACTIC ACID, PLASMA   APTT  HEPARIN LEVEL (UNFRACTIONATED)  HIV ANTIBODY (ROUTINE TESTING W REFLEX)  PROTIME-INR  CORTISOL-AM, BLOOD  PROCALCITONIN  TSH  CBC  BASIC METABOLIC PANEL  POC URINE PREG, ED     PROCEDURES  Procedure(s) performed (including Critical Care):  .Critical Care  Date/Time: 03/27/2021 5:17 PM Performed by: Chesley Noon, MD Authorized by: Chesley Noon, MD   Critical care provider statement:    Critical care time (minutes):  45   Critical care time was exclusive  of:  Separately billable procedures and treating other patients and teaching time   Critical care was necessary to treat or prevent imminent or life-threatening deterioration of the following conditions:  Sepsis   Critical care was time spent personally by me on the following activities:  Discussions with consultants, evaluation of patient's response to treatment, examination of patient, ordering and performing treatments and interventions, ordering and review of laboratory studies, ordering and review of radiographic studies, pulse oximetry, re-evaluation of patient's condition, obtaining history from patient or surrogate and review of old charts   I assumed direction of critical care for this patient from another provider in my specialty: no     Care discussed with: admitting provider    ED ECG REPORT I, Chesley Noon, the attending physician, personally viewed and interpreted this ECG.   Date: 03/27/2021  EKG Time: 17:44  Rate: 134  Rhythm: sinus tachycardia  Axis: Normal  Intervals:none  ST&T Change: None  ____________________________________________   INITIAL IMPRESSION / ASSESSMENT AND PLAN / ED COURSE      30 year old female with past medical history of asthma presents to the ED complaining of nausea and vomiting for the past 3 to 4 days associated with subjective fevers and chills as well as right upper quadrant abdominal pain.  Patient noted to be febrile and mildly tachycardic on arrival, she is  well-appearing with normal respiratory rate and blood pressure, low suspicion for sepsis at this time.  She was given Tylenol and we will hydrate with IV fluids, treat with additional IV antiemetic.  We will further assess for source of her fever with chest x-ray, UA, right upper quadrant ultrasound, and testing for COVID-19.  She declines any pain medication at this time.  Labs remarkable for leukocytosis but otherwise reassuring, testing for COVID-19 is negative.  Chest x-ray reviewed by me and shows no infiltrate, edema, or effusion, right upper quadrant ultrasound is also unremarkable.  UA is consistent with UTI, CT scan was performed to rule out obstructive urinary process and is consistent with pyelonephritis.  It additionally shows ovarian vein thrombosis, potentially related to patient's pyelonephritis.  Patient was given IV Rocephin and given ovarian vein thrombosis we will add on anaerobic coverage with IV Flagyl.  Findings discussed with vascular surgery, who recommends starting patient on heparin.  Findings also discussed with Dr. Bonney Aid from OB/GYN, who evaluated the patient and will follow during admission.  Plan to discuss with hospitalist for admission.      ____________________________________________   FINAL CLINICAL IMPRESSION(S) / ED DIAGNOSES  Final diagnoses:  RUQ pain  Sepsis without acute organ dysfunction, due to unspecified organism [facility])  Pyelonephritis  Thrombosis of ovarian vein     ED Discharge Orders     None        Note:  This document was prepared using Dragon voice recognition software and may include unintentional dictation errors.    Chesley Noon, MD 03/27/21 1719    Chesley Noon, MD 03/27/21 [phone]

## 2021-03-28 ENCOUNTER — Encounter: Payer: Self-pay | Admitting: Internal Medicine

## 2021-03-28 DIAGNOSIS — R1011 Right upper quadrant pain: Secondary | ICD-10-CM | POA: Diagnosis not present

## 2021-03-28 DIAGNOSIS — N12 Tubulo-interstitial nephritis, not specified as acute or chronic: Secondary | ICD-10-CM | POA: Diagnosis not present

## 2021-03-28 DIAGNOSIS — N921 Excessive and frequent menstruation with irregular cycle: Secondary | ICD-10-CM

## 2021-03-28 DIAGNOSIS — E669 Obesity, unspecified: Secondary | ICD-10-CM | POA: Diagnosis not present

## 2021-03-28 DIAGNOSIS — R1031 Right lower quadrant pain: Secondary | ICD-10-CM | POA: Diagnosis not present

## 2021-03-28 DIAGNOSIS — Z793 Long term (current) use of hormonal contraceptives: Secondary | ICD-10-CM | POA: Diagnosis not present

## 2021-03-28 DIAGNOSIS — Z28310 Unvaccinated for covid-19: Secondary | ICD-10-CM | POA: Diagnosis not present

## 2021-03-28 DIAGNOSIS — Z6841 Body Mass Index (BMI) 40.0 and over, adult: Secondary | ICD-10-CM | POA: Diagnosis not present

## 2021-03-28 DIAGNOSIS — I82890 Acute embolism and thrombosis of other specified veins: Secondary | ICD-10-CM

## 2021-03-28 DIAGNOSIS — Z8249 Family history of ischemic heart disease and other diseases of the circulatory system: Secondary | ICD-10-CM | POA: Diagnosis not present

## 2021-03-28 DIAGNOSIS — A419 Sepsis, unspecified organism: Secondary | ICD-10-CM | POA: Diagnosis not present

## 2021-03-28 DIAGNOSIS — R000 Tachycardia, unspecified: Secondary | ICD-10-CM | POA: Diagnosis not present

## 2021-03-28 DIAGNOSIS — N289 Disorder of kidney and ureter, unspecified: Secondary | ICD-10-CM | POA: Diagnosis not present

## 2021-03-28 DIAGNOSIS — Z20822 Contact with and (suspected) exposure to covid-19: Secondary | ICD-10-CM | POA: Diagnosis not present

## 2021-03-28 DIAGNOSIS — A4151 Sepsis due to Escherichia coli [E. coli]: Secondary | ICD-10-CM | POA: Diagnosis not present

## 2021-03-28 DIAGNOSIS — R112 Nausea with vomiting, unspecified: Secondary | ICD-10-CM | POA: Diagnosis not present

## 2021-03-28 DIAGNOSIS — Z98891 History of uterine scar from previous surgery: Secondary | ICD-10-CM | POA: Diagnosis not present

## 2021-03-28 DIAGNOSIS — R509 Fever, unspecified: Secondary | ICD-10-CM | POA: Diagnosis not present

## 2021-03-28 DIAGNOSIS — I10 Essential (primary) hypertension: Secondary | ICD-10-CM | POA: Diagnosis not present

## 2021-03-28 DIAGNOSIS — L680 Hirsutism: Secondary | ICD-10-CM | POA: Diagnosis not present

## 2021-03-28 DIAGNOSIS — K760 Fatty (change of) liver, not elsewhere classified: Secondary | ICD-10-CM | POA: Diagnosis not present

## 2021-03-28 DIAGNOSIS — Z87440 Personal history of urinary (tract) infections: Secondary | ICD-10-CM | POA: Diagnosis not present

## 2021-03-28 DIAGNOSIS — R7303 Prediabetes: Secondary | ICD-10-CM | POA: Diagnosis not present

## 2021-03-28 LAB — BLOOD CULTURE ID PANEL (REFLEXED) - BCID2

## 2021-03-28 LAB — BASIC METABOLIC PANEL
Anion gap: 9 (ref 5–15)
BUN: 9 mg/dL (ref 6–20)
CO2: 24 mmol/L (ref 22–32)
Calcium: 8.3 mg/dL — ABNORMAL LOW (ref 8.9–10.3)
Chloride: 105 mmol/L (ref 98–111)
Creatinine, Ser: 0.79 mg/dL (ref 0.44–1.00)
GFR, Estimated: >60 mL/min (ref >60)
Glucose, Bld: 121 mg/dL — ABNORMAL HIGH (ref 70–99)
Potassium: 3.6 mmol/L (ref 3.5–5.1)
Sodium: 138 mmol/L (ref 135–145)

## 2021-03-28 LAB — HEMOGLOBIN A1C
Hgb A1c MFr Bld: 5.7 % — ABNORMAL HIGH (ref 4.8–5.6)
Mean Plasma Glucose: 116.89 mg/dL

## 2021-03-28 LAB — CBC
HCT: 28.6 % — ABNORMAL LOW (ref 36.0–46.0)
Hemoglobin: 9.2 g/dL — ABNORMAL LOW (ref 12.0–15.0)
MCH: 21.6 pg — ABNORMAL LOW (ref 26.0–34.0)
MCHC: 32.2 g/dL (ref 30.0–36.0)
MCV: 67.1 fL — ABNORMAL LOW (ref 80.0–100.0)
Platelets: 175 10*3/uL (ref 150–400)
RBC: 4.26 MIL/uL (ref 3.87–5.11)
RDW: 18.5 % — ABNORMAL HIGH (ref 11.5–15.5)
WBC: 13.6 10*3/uL — ABNORMAL HIGH (ref 4.0–10.5)
nRBC: 0 % (ref 0.0–0.2)

## 2021-03-28 LAB — PROTIME-INR
INR: 1.4 — ABNORMAL HIGH (ref 0.8–1.2)
Prothrombin Time: 17.1 seconds — ABNORMAL HIGH (ref 11.4–15.2)

## 2021-03-28 LAB — HEPARIN LEVEL (UNFRACTIONATED)
Heparin Unfractionated: 0.13 IU/mL — ABNORMAL LOW (ref 0.30–0.70)
Heparin Unfractionated: 0.14 IU/mL — ABNORMAL LOW (ref 0.30–0.70)
Heparin Unfractionated: 0.12 IU/mL — ABNORMAL LOW (ref 0.30–0.70)

## 2021-03-28 LAB — PROCALCITONIN: Procalcitonin: 34.94 ng/mL

## 2021-03-28 LAB — CHLAMYDIA/NGC RT PCR (ARMC ONLY)
Chlamydia Tr: NOT DETECTED
N gonorrhoeae: NOT DETECTED

## 2021-03-28 LAB — HIV ANTIBODY (ROUTINE TESTING W REFLEX): HIV Screen 4th Generation wRfx: NONREACTIVE

## 2021-03-28 LAB — CORTISOL-AM, BLOOD: Cortisol - AM: 31.8 ug/dL — ABNORMAL HIGH (ref 6.7–22.6)

## 2021-03-28 MED ORDER — HEPARIN BOLUS VIA INFUSION
2500.0000 [IU] | Freq: Once | INTRAVENOUS | Status: AC
  Administered 2021-03-28: 2500 [IU] via INTRAVENOUS
  Filled 2021-03-28: qty 2500

## 2021-03-28 MED ORDER — HEPARIN BOLUS VIA INFUSION
3000.0000 [IU] | Freq: Once | INTRAVENOUS | Status: AC
  Administered 2021-03-28: 3000 [IU] via INTRAVENOUS
  Filled 2021-03-28: qty 3000

## 2021-03-28 MED ORDER — SODIUM CHLORIDE 0.9 % IV SOLN
2.0000 g | INTRAVENOUS | Status: DC
  Administered 2021-03-28 – 2021-03-30 (×3): 2 g via INTRAVENOUS
  Filled 2021-03-28: qty 2
  Filled 2021-03-28: qty 20
  Filled 2021-03-28: qty 2

## 2021-03-28 MED ORDER — IBUPROFEN 400 MG PO TABS
800.0000 mg | ORAL_TABLET | Freq: Three times a day (TID) | ORAL | Status: DC | PRN
  Administered 2021-03-28 – 2021-03-30 (×4): 800 mg via ORAL
  Filled 2021-03-28 (×4): qty 2

## 2021-03-28 MED ORDER — HEPARIN BOLUS VIA INFUSION
2400.0000 [IU] | Freq: Once | INTRAVENOUS | Status: AC
  Administered 2021-03-28: 2400 [IU] via INTRAVENOUS
  Filled 2021-03-28: qty 2400

## 2021-03-28 MED ORDER — HEPARIN BOLUS VIA INFUSION
4000.0000 [IU] | Freq: Once | INTRAVENOUS | Status: AC
  Administered 2021-03-28: 4000 [IU] via INTRAVENOUS
  Filled 2021-03-28: qty 4000

## 2021-03-28 NOTE — Progress Notes (Signed)
ANTICOAGULATION CONSULT NOTE  Pharmacy Consult for heparin infusion Indication: ovarian vein thrombosis  No Known Allergies  Patient Measurements: Height: 5' 4.02" (162.6 cm) Weight: 111.1 kg (245 lb) IBW/kg (Calculated) : 54.74 Heparin Dosing Weight: 81.4 kg  Vital Signs: Temp: 98.7 F (37.1 C) (08/14 1602) Temp Source: Oral (08/14 1602) BP: 135/76 (08/14 1602) Pulse Rate: 94 (08/14 1602)  Labs: Recent Labs    03/26/21 1934 03/27/21 1328 03/27/21 1708 03/27/21 2239 03/28/21 0543 03/28/21 1436 03/28/21 2133  HGB 11.3* 10.6*  --   --  9.2*  --   --   HCT 36.4 33.4*  --   --  28.6*  --   --   PLT 232 219  --   --  175  --   --   APTT  --   --  32  --   --   --   --   LABPROT  --   --  16.9*  --  17.1*  --   --   INR  --   --  1.4*  --  1.4*  --   --   HEPARINUNFRC  --   --   --    < > 0.13* 0.14* 0.12*  CREATININE 0.65 0.89  --   --  0.79  --   --    < > = values in this interval not displayed.     Estimated Creatinine Clearance: 127.8 mL/min (by C-G formula based on SCr of 0.79 mg/dL).   Medical History: Past Medical History:  Diagnosis Date   Asthma    BMI 40.0-44.9, adult (HCC)    History of blood transfusion 2019   Recurrent UTI     Medications:  Spoke with patient and confirmed she is not taking any anticoagulation  Assessment: 30 yo female  with past medical history of asthma and recurrent UTIs who presented to the ED complaining of nausea and vomiting. Pt also complained of right upper quadrant pain of her abdomen. Pharmacy has been consulted for heparin dosing and monitoring.   8/13 CT abdomen: findings consistent with R and possible L ovarian vein thrombosis   Goal of Therapy:  Heparin level 0.3-0.7 units/ml Monitor platelets by anticoagulation protocol: Yes  08/13 2239 HL 0.16, subtherapeutic 08/14 0543 HL 0.13, subtherapeutic  08/14 1436 HL 0.14, subtherapeutic 08/14 2133 HL 0.12, subtherapeutic   Plan:  Heparin level is subtherapeutic.  Will give 2500 units bolus x 1 and increase the heparin infusion 2400 units/hr. Recheck heparin level in 6 hours after rate change. CBC daily while on heparin.    Raiford Noble, PharmD 03/28/2021 10:09 PM

## 2021-03-28 NOTE — Progress Notes (Signed)
ANTICOAGULATION CONSULT NOTE  Pharmacy Consult for heparin infusion Indication: ovarian vein thrombosis  No Known Allergies  Patient Measurements: Height: 5' 4.02" (162.6 cm) Weight: 111.1 kg (245 lb) IBW/kg (Calculated) : 54.74 Heparin Dosing Weight: 81.4 kg  Vital Signs: Temp: 99.7 F (37.6 C) (08/14 0353) Temp Source: Oral (08/14 0353) BP: 120/71 (08/14 0353) Pulse Rate: 102 (08/14 0353)  Labs: Recent Labs    03/26/21 1934 03/27/21 1328 03/27/21 1708 03/27/21 2239 03/28/21 0543  HGB 11.3* 10.6*  --   --  9.2*  HCT 36.4 33.4*  --   --  28.6*  PLT 232 219  --   --  175  APTT  --   --  32  --   --   LABPROT  --   --  16.9*  --  17.1*  INR  --   --  1.4*  --  1.4*  HEPARINUNFRC  --   --   --  0.16* 0.13*  CREATININE 0.65 0.89  --   --  0.79     Estimated Creatinine Clearance: 127.8 mL/min (by C-G formula based on SCr of 0.79 mg/dL).   Medical History: Past Medical History:  Diagnosis Date   Asthma    BMI 40.0-44.9, adult (HCC)    History of blood transfusion 2019   Recurrent UTI     Medications:  Spoke with patient and confirmed she is not taking any anticoagulation  Assessment: 30 yo female  with past medical history of asthma and recurrent UTIs who presented to the ED complaining of nausea and vomiting. Pt also complained of right upper quadrant pain of her abdomen. Pharmacy has been consulted for heparin dosing and monitoring.   8/13 CT abdomen: findings consistent with R and possible L ovarian vein thrombosis   Goal of Therapy:  Heparin level 0.3-0.7 units/ml Monitor platelets by anticoagulation protocol: Yes  08/13 2239 HL 0.16, subtherapeutic 08/14 0543 HL 0.13, subtherapeutic    Plan:  Heparin level is therapeutic. Will give 3000 units bolus x 1 and increase the heparin infusion 1900 units/hr. Recheck heparin level in 6 hours. CBC daily while on heparin.   Paschal Dopp, PharmD 03/28/2021 7:17 AM

## 2021-03-28 NOTE — Progress Notes (Signed)
PHARMACY - PHYSICIAN COMMUNICATION CRITICAL VALUE ALERT - BLOOD CULTURE IDENTIFICATION (BCID)   BCID results:  2 of 4 vials (1 aerobic, 1 anaerobic) with E. Coli, no resistance.  Pt is on Ceftriaxone 1 gm and Flagyl.    Name of physician contacted: Andrez Grime, MD  Changes to prescribed antibiotics required: Increase Ceftriaxone to 2 gm q24h.  Continue Flagyl as requested by OB provider.  Otelia Sergeant, PharmD, [facility] 03/28/2021 4:07 AM

## 2021-03-28 NOTE — Progress Notes (Addendum)
PROGRESS NOTE    Rachel Clayton  GYI:[id number] DOB: 02-Feb-1991 DOA: 03/27/2021 PCP: Patient, No Pcp Per (Inactive)   Brief Narrative: Taken from H&P. Rachel Clayton is a 30 y.o. female with medical history significant for history of C-section and successful VBAC, obesity, asthma, history of recurrent UTI, presents the emergency department for chief concerns of persistent nausea, vomiting, fever, chills.  Symptoms started on 8/11, initially presented to ED on 03/26/2021 but left without being seen due to prolonged wait time. Due to persistent nausea and vomiting and unable to keep anything down she came back to ED on 03/27/2021.  She was found to be febrile at 101, tachycardic at 112, mild tachypnea in the 20s, leukocytosis 18.3, hemoglobin of 10.6, UA with large leukocytes.  CT abdomen with concern of bilateral pyelonephritis, A hypodense lesion arising from upper left kidney which has been increased in size from a prior exam done in 2018.  They were recommending renal protocol MRI for better characterization after the resolution of acute symptoms. There was also right and possibly left ovarian vein thrombosis and mild edema adjacent to the left ovarian vein and some retroperitoneal inflammation.  Diverticulosis without any sign of diverticulitis.  Admitted with sepsis secondary to UTI and ovarian vein thrombosis.  OB/GYN and and vascular surgery was consulted.  Blood cultures growing E. Coli.  She was started on heparin infusion.  Subjective: Patient was complaining of lower abdominal pain, stating that it is better than before but continues to have some.  No nausea or vomiting since morning.  Flank pain improving.  She has 4 kids with eldest being 17 year old and youngest was born in 2018.  Able to tolerate some diet although appetite remains poor. According to patient her mother and grandmother both lost 1 kidney each for unknown reason.  Assessment & Plan:   Active Problems:   Menorrhagia  with irregular cycle   Obesity BMI=42.3   Hirsutism   Sepsis ([place])   Essential hypertension   Thrombosis of ovarian vein   Pyelonephritis  Sepsis secondary to UTI and pyelonephritis.  Patient met sepsis criteria with being febrile, leukocytosis, tachycardia and tachypnea.  Received IV fluid in ED and was given broad-spectrum antibiotics with ceftriaxone and Flagyl. Blood cultures growing E. coli.  CT abdomen without any sign of diverticulitis.  Urine cultures pending. Procalcitonin elevated at 34.94 OB/GYN sent for gonorrhea and chlamydia which came back negative. Discontinue Flagyl -Continue with ceftriaxone -Follow-up culture susceptibility -Continue supportive care  Bilateral ovarian vein thrombosis.  Can be due to sepsis, little unusual as patient was not on any oral contraceptives and most recent pregnancy was in 2018 which are the most common causes. Per vascular surgery she will need anticoagulation for at least 3 months depending on her labs and further work-up. -Will need hypercoagulable and malignant work-up-ordered which includes prothrombin gene mutation, Antithrombin panel, factor V Leyden panel, antiphospholipid antibody and ANA. -Continue heparin till pain improves and then she can transition to NOAC. -Appreciate vascular surgery recommendations.  Menorrhagia.  OB/GYN was also consulted and they are recommending hormonal IUD which can be done as an outpatient.  Anemia.  Most likely secondary to menorrhagia. -Continue with home iron supplement. -Monitor hemoglobin -Transfuse if below 7  Left kidney hypodense lesion.  Patient will need a renal MRI once improved from current infection. PCP should be able to obtain it.  Elevated blood pressure on admission.  Most likely secondary to pain as blood pressure improved with pain control.  Stage  III obesity. Estimated body mass index is 42.03 kg/m as calculated from the following:   Height as of this encounter: 5' 4.02"  (1.626 m).   Weight as of this encounter: 111.1 kg.  Patient needs continuation of counseling.  Hirsutism.  Thick hairs on abdomen, chin etc. -Will need OB/GYN work-up as an outpatient -We will check A1c  Objective: Vitals:   03/27/21 2230 03/27/21 2329 03/28/21 0353 03/28/21 0758  BP: 106/79 (!) 100/55 120/71 116/69  Pulse: (!) 109 (!) 103 (!) 102 94  Resp: _0 Temp: 99.7 F (37.6 C) 98.6 F (37 C) 99.7 F (37.6 C) 98.6 F (37 C)  TempSrc: Oral Oral Oral Oral  SpO2: 99% 100% 98% 100%  Weight:      Height:        Intake/Output Summary (Last 24 hours) at 03/28/2021 1352 Last data filed at 03/28/2021 0900 Gross per 24 hour  Intake 1271.18 ml  Output --  Net 1271.18 ml   Filed Weights   03/27/21 2000  Weight: 111.1 kg    Examination:  General exam: Appears calm and comfortable  Respiratory system: Clear to auscultation. Respiratory effort normal. Cardiovascular system: S1 & S2 heard, RRR.  Gastrointestinal system: Soft, nontender, nondistended, bowel sounds positive. Central nervous system: Alert and oriented. No focal neurological deficits.Symmetric 5 x 5 power. Extremities: No edema, no cyanosis, pulses intact and symmetrical. Psychiatry: Judgement and insight appear normal. Mood & affect appropriate.    DVT prophylaxis: Heparin infusion Code Status: Full Family Communication: Discussed with patient Disposition Plan:  Status is: Inpatient  Remains inpatient appropriate because:Inpatient level of care appropriate due to severity of illness  Dispo: The patient is from: Home              Anticipated d/c is to: Home              Patient currently is not medically stable to d/c.   Difficult to place patient No               Level of care: Progressive Cardiac  All the records are reviewed and case discussed with Care Management/Social Worker. Management plans discussed with the patient, nursing and they are in agreement.  Consultants:   OB/GYN Vascular surgery  Procedures:  Antimicrobials:  Ceftriaxone  Data Reviewed: I have personally reviewed following labs and imaging studies  CBC: Recent Labs  Lab 03/26/21 1934 03/27/21 1328 03/28/21 0543  WBC 16.2* 18.3* 13.6*  NEUTROABS  --  16.8*  --   HGB 11.3* 10.6* 9.2*  HCT 36.4 33.4* 28.6*  MCV 67.8* 68.7* 67.1*  PLT 232 219 992   Basic Metabolic Panel: Recent Labs  Lab 03/26/21 1934 03/27/21 1328 03/27/21 2239 03/28/21 0543  NA 131* 134*  --  138  K 3.4* 3.1*  --  3.6  CL 100 103  --  105  CO2 21* 22  --  24  GLUCOSE 135* 130*  --  121*  BUN 8 10  --  9  CREATININE 0.65 0.89  --  0.79  CALCIUM 8.9 8.7*  --  8.3*  MG  --   --  1.7  --    GFR: Estimated Creatinine Clearance: 127.8 mL/min (by C-G formula based on SCr of 0.79 mg/dL). Liver Function Tests: Recent Labs  Lab 03/26/21 1934 03/27/21 1328  AST 19 20  ALT 18 21  ALKPHOS 57 59  BILITOT 1.3* 1.6*  PROT 8.0 7.9  ALBUMIN 4.0 3.6  Recent Labs  Lab 03/26/21 1934 03/27/21 1328  LIPASE 23 20   No results for input(s): AMMONIA in the last 168 hours. Coagulation Profile: Recent Labs  Lab 03/27/21 1708 03/28/21 0543  INR 1.4* 1.4*   Cardiac Enzymes: No results for input(s): CKTOTAL, CKMB, CKMBINDEX, TROPONINI in the last 168 hours. BNP (last 3 results) No results for input(s): PROBNP in the last 8760 hours. HbA1C: No results for input(s): HGBA1C in the last 72 hours. CBG: No results for input(s): GLUCAP in the last 168 hours. Lipid Profile: No results for input(s): CHOL, HDL, LDLCALC, TRIG, CHOLHDL, LDLDIRECT in the last 72 hours. Thyroid Function Tests: Recent Labs    03/27/21 2239  TSH 1.554   Anemia Panel: No results for input(s): VITAMINB12, FOLATE, FERRITIN, TIBC, IRON, RETICCTPCT in the last 72 hours. Sepsis Labs: Recent Labs  Lab 03/27/21 1508 03/27/21 2239  PROCALCITON  --  34.94  LATICACIDVEN 1.1  --     Recent Results (from the past 240 hour(s))  Resp  Panel by RT-PCR (Flu A&B, Covid) Nasopharyngeal Swab     Status: None   Collection Time: 03/27/21  1:28 PM   Specimen: Nasopharyngeal Swab; Nasopharyngeal(NP) swabs in vial transport medium  Result Value Ref Range Status   SARS Coronavirus 2 by RT PCR NEGATIVE NEGATIVE Final    Comment: (NOTE) SARS-CoV-2 target nucleic acids are NOT DETECTED.  The SARS-CoV-2 RNA is generally detectable in upper respiratory specimens during the acute phase of infection. The lowest concentration of SARS-CoV-2 viral copies this assay can detect is 138 copies/mL. A negative result does not preclude SARS-Cov-2 infection and should not be used as the sole basis for treatment or other patient management decisions. A negative result may occur with  improper specimen collection/handling, submission of specimen other than nasopharyngeal swab, presence of viral mutation(s) within the areas targeted by this assay, and inadequate number of viral copies(<138 copies/mL). A negative result must be combined with clinical observations, patient history, and epidemiological information. The expected result is Negative.  Fact Sheet for Patients:  EntrepreneurPulse.com.au  Fact Sheet for Healthcare Providers:  IncredibleEmployment.be  This test is no t yet approved or cleared by the [place] FDA and  has been authorized for detection and/or diagnosis of SARS-CoV-2 by FDA under an Emergency Use Authorization (EUA). This EUA will remain  in effect (meaning this test can be used) for the duration of the COVID-19 declaration under Section 564(b)(1) of the Act, 21 U.S.C.section 360bbb-3(b)(1), unless the authorization is terminated  or revoked sooner.       Influenza A by PCR NEGATIVE NEGATIVE Final   Influenza B by PCR NEGATIVE NEGATIVE Final    Comment: (NOTE) The Xpert Xpress SARS-CoV-2/FLU/RSV plus assay is intended as an aid in the diagnosis of influenza from Nasopharyngeal  swab specimens and should not be used as a sole basis for treatment. Nasal washings and aspirates are unacceptable for Xpert Xpress SARS-CoV-2/FLU/RSV testing.  Fact Sheet for Patients: EntrepreneurPulse.com.au  Fact Sheet for Healthcare Providers: IncredibleEmployment.be  This test is not yet approved or cleared by the [place] FDA and has been authorized for detection and/or diagnosis of SARS-CoV-2 by FDA under an Emergency Use Authorization (EUA). This EUA will remain in effect (meaning this test can be used) for the duration of the COVID-19 declaration under Section 564(b)(1) of the Act, 21 U.S.C. section 360bbb-3(b)(1), unless the authorization is terminated or revoked.  Performed at [facility], [address] [place], [place] [address]   Culture,  blood (routine x 2)     Status: None (Preliminary result)   Collection Time: 03/27/21  3:08 PM   Specimen: BLOOD  Result Value Ref Range Status   Specimen Description   Final    BLOOD RIGHT ANTECUBITAL Performed at [facility], [address], [place], [place] [address]    Special Requests   Final    BOTTLES DRAWN AEROBIC AND ANAEROBIC Blood Culture adequate volume Performed at [facility], [address] [place], [place] [address]    Culture  Setup Time   Final    GRAM NEGATIVE RODS ANAEROBIC BOTTLE ONLY CRITICAL VALUE NOTED.  VALUE IS CONSISTENT WITH PREVIOUSLY REPORTED AND CALLED VALUE. Performed at [facility], [place] [address], [place], [place] [address]    Culture GRAM NEGATIVE RODS  Final   Report Status PENDING  Incomplete  Culture, blood (routine x 2)     Status: None (Preliminary result)   Collection Time: 03/27/21  3:08 PM   Specimen: BLOOD  Result Value Ref Range Status   Specimen Description BLOOD BLOOD RIGHT FOREARM  Final   Special Requests   Final    BOTTLES DRAWN AEROBIC AND ANAEROBIC Blood Culture results may not be optimal  due to an inadequate volume of blood received in culture bottles   Culture  Setup Time   Final    Organism ID to follow GRAM NEGATIVE RODS IN BOTH AEROBIC AND ANAEROBIC BOTTLES CRITICAL RESULT CALLED TO, READ BACK BY AND VERIFIED WITHLloyd Huger @ 0348 03/28/2021 LFD Performed at [facility], [address], [place], [place] [address]    Culture GRAM NEGATIVE RODS  Final   Report Status PENDING  Incomplete  Chlamydia/NGC rt PCR ([place] only)     Status: None   Collection Time: 03/27/21  3:08 PM   Specimen: Cervical/Vaginal swab  Result Value Ref Range Status   Specimen source GC/Chlam URINE, RANDOM  Final   Chlamydia Tr NOT DETECTED NOT DETECTED Final   N gonorrhoeae NOT DETECTED NOT DETECTED Final    Comment: (NOTE) This CT/NG assay has not been evaluated in patients with a history of  hysterectomy. Performed at [facility], [address] [place], [place] [address]   Blood Culture ID Panel (Reflexed)     Status: Abnormal   Collection Time: 03/27/21  3:08 PM  Result Value Ref Range Status   Enterococcus faecalis NOT DETECTED NOT DETECTED Final   Enterococcus Faecium NOT DETECTED NOT DETECTED Final   Listeria monocytogenes NOT DETECTED NOT DETECTED Final   Staphylococcus species NOT DETECTED NOT DETECTED Final   Staphylococcus aureus (BCID) NOT DETECTED NOT DETECTED Final   Staphylococcus epidermidis NOT DETECTED NOT DETECTED Final   Staphylococcus lugdunensis NOT DETECTED NOT DETECTED Final   Streptococcus species NOT DETECTED NOT DETECTED Final   Streptococcus agalactiae NOT DETECTED NOT DETECTED Final   Streptococcus pneumoniae NOT DETECTED NOT DETECTED Final   Streptococcus pyogenes NOT DETECTED NOT DETECTED Final   A.calcoaceticus-baumannii NOT DETECTED NOT DETECTED Final   Bacteroides fragilis NOT DETECTED NOT DETECTED Final   Enterobacterales DETECTED (A) NOT DETECTED Final    Comment: Enterobacterales represent a large order of gram negative  bacteria, not a single organism. CRITICAL RESULT CALLED TO, READ BACK BY AND VERIFIED WITH: NATHAN BELUE @ 2060 03/28/21 LFD     Enterobacter cloacae complex NOT DETECTED NOT DETECTED Final   Escherichia coli DETECTED (A) NOT DETECTED Final    Comment: CRITICAL RESULT CALLED TO, READ BACK  BY AND VERIFIED WITH: NATHAN BELUE @ 1660 03/28/21 LFD    Klebsiella aerogenes NOT DETECTED NOT DETECTED Final   Klebsiella oxytoca NOT DETECTED NOT DETECTED Final   Klebsiella pneumoniae NOT DETECTED NOT DETECTED Final   Proteus species NOT DETECTED NOT DETECTED Final   Salmonella species NOT DETECTED NOT DETECTED Final   Serratia marcescens NOT DETECTED NOT DETECTED Final   Haemophilus influenzae NOT DETECTED NOT DETECTED Final   Neisseria meningitidis NOT DETECTED NOT DETECTED Final   Pseudomonas aeruginosa NOT DETECTED NOT DETECTED Final   Stenotrophomonas maltophilia NOT DETECTED NOT DETECTED Final   Candida albicans NOT DETECTED NOT DETECTED Final   Candida auris NOT DETECTED NOT DETECTED Final   Candida glabrata NOT DETECTED NOT DETECTED Final   Candida krusei NOT DETECTED NOT DETECTED Final   Candida parapsilosis NOT DETECTED NOT DETECTED Final   Candida tropicalis NOT DETECTED NOT DETECTED Final   Cryptococcus neoformans/gattii NOT DETECTED NOT DETECTED Final   CTX-M ESBL NOT DETECTED NOT DETECTED Final   Carbapenem resistance IMP NOT DETECTED NOT DETECTED Final   Carbapenem resistance KPC NOT DETECTED NOT DETECTED Final   Carbapenem resistance NDM NOT DETECTED NOT DETECTED Final   Carbapenem resist OXA 48 LIKE NOT DETECTED NOT DETECTED Final   Carbapenem resistance VIM NOT DETECTED NOT DETECTED Final    Comment: Performed at [facility], [address], [place], [place] [address]     Radiology Studies: DG Chest 2 View  Result Date: 03/27/2021 CLINICAL DATA:  30 year old female with fever EXAM: CHEST - 2 VIEW COMPARISON:  01/12/2021 FINDINGS: Cardiomediastinal silhouette  unchanged in size and contour. No evidence of central vascular congestion. No interlobular septal thickening. No pneumothorax or pleural effusion. Coarsened interstitial markings, with no confluent airspace disease. No acute displaced fracture. Degenerative changes of the spine. IMPRESSION: No active cardiopulmonary disease. Electronically Signed   By: Corrie Mckusick D.O.   On: 03/27/2021 13:09   CT Abdomen Pelvis W Contrast  Result Date: 03/27/2021 CLINICAL DATA:  Abdominal abscess/infection suspected Fever and vomiting. EXAM: CT ABDOMEN AND PELVIS WITH CONTRAST TECHNIQUE: Multidetector CT imaging of the abdomen and pelvis was performed using the standard protocol following bolus administration of intravenous contrast. CONTRAST:  15m OMNIPAQUE IOHEXOL 350 MG/ML SOLN COMPARISON:  Abdominal ultrasound earlier today. FINDINGS: Lower chest: The lung bases are clear. No acute airspace disease or pleural effusion. Hepatobiliary: Diffusely decreased hepatic density consistent with steatosis. Slightly more focal fatty infiltration adjacent to the falciform ligament. No suspicious liver lesion. Gallbladder physiologically distended, no calcified stone. No biliary dilatation. Pancreas: No ductal dilatation or inflammation. Spleen: Borderline enlarged spanning 13.6 x 12.6 x 5.1 cm (volume = 460 cm^3). Splenule posteriorly but no focal lesion. Adrenals/Urinary Tract: No adrenal nodule. Heterogeneous left renal enhancement with perinephric edema. No visualized renal calculi. 12 x 16 mm hypodense lesion arising from the upper left kidney is nonspecific. This does not have peripheral enhancement or thick walls. There is slight right renal heterogeneity and equivocal perinephric edema. Urinary bladder is minimally distended no definite bladder wall thickening or perivesicular fat stranding. Stomach/Bowel: Decompressed stomach. No small bowel obstruction or inflammation. Normal appendix. There is scattered colonic  diverticulosis. No diverticulitis or acute colonic inflammation. Vascular/Lymphatic: Normal caliber abdominal aorta. The right ovarian vein is expanded and contains intraluminal low densities suspicious for ovarian vein thrombus, for example series 2 images 52 through 62. There is questionable low-density involving the left ovarian vein which is ill-defined, for example series 2, image 45. There multiple small  retroperitoneal lymph nodes that are not enlarged by size criteria. No enlarged lymph nodes in the abdomen or pelvis. Reproductive: Anteverted uterus. The ovaries are symmetric in size. Probable corpus luteal cyst in the right ovary. Other: No free air, free fluid, or intra-abdominal fluid collection. Musculoskeletal: There are no acute or suspicious osseous abnormalities. IMPRESSION: 1. Heterogeneous left renal enhancement with perinephric edema, suspicious for pyelonephritis. There is also mild heterogeneous right renal enhancement which may represent pyelonephritis. 2. Indeterminate 12 x 16 mm hypodense lesion arising from the upper left kidney. This does not have peripheral enhancement or thick wall to suggest abscess. A 9 mm lesion with seen on prior 11/24/2016 abdominal MRI recommend. Recommend follow-up renal protocol MRI for characterization after resolution of acute symptoms. 3. Findings consistent with right and possibly left ovarian vein thrombosis. There is mild edema adjacent to the left ovarian vein. While this may be reactive related to retroperitoneal inflammation, etiology is indeterminate. 4. Hepatic steatosis. Borderline splenomegaly. 5. Colonic diverticulosis without diverticulitis. These results were called by telephone at the time of interpretation on 03/27/2021 at 4:28 pm to provider [facility] , who verbally acknowledged these results. Electronically Signed   By: Keith Rake M.D.   On: 03/27/2021 16:29   US Abdomen Limited RUQ (LIVER/GB)  Result Date: 03/27/2021 CLINICAL  DATA:  RIGHT upper quadrant pain, nausea and vomiting for 4 days. EXAM: ULTRASOUND ABDOMEN LIMITED RIGHT UPPER QUADRANT COMPARISON:  None. FINDINGS: Gallbladder: No gallstones or wall thickening visualized. No sonographic Murphy sign noted by sonographer. Common bile duct: Diameter: 1 mm Liver: No focal lesion identified. Liver is diffusely echogenic suggesting fatty infiltration. Portal vein is patent on color Doppler imaging with normal direction of blood flow towards the liver. Other: None. IMPRESSION: 1. No acute findings. Gallbladder appears normal. No bile duct dilatation. 2. Probable fatty infiltration of the liver. Electronically Signed   By: Franki Cabot M.D.   On: 03/27/2021 14:11    Scheduled Meds:  ferrous sulfate  325 mg Oral BID WC   Continuous Infusions:  cefTRIAXone (ROCEPHIN)  IV 2 g (03/28/21 0629)   heparin 1,900 Units/hr (03/28/21 0845)   metronidazole 500 mg (03/28/21 0511)   promethazine (PHENERGAN) injection (IM or IVPB) Stopped (03/27/21 1913)     LOS: 0 days   Time spent: 45 minutes. More than 50% of the time was spent in counseling/coordination of care  Lorella Nimrod, MD [facility]  If 7PM-7AM, please contact night-coverage [email or web address]  03/28/2021, 1:52 PM   This record has been created using Systems analyst. Errors have been sought and corrected,but may not always be located. Such creation errors do not reflect on the standard of care.

## 2021-03-28 NOTE — Consult Note (Signed)
[facility] Consultation       MRN : [id number]  Rachel Clayton is a 30 y.o. ([birth year]) female who presents with chief complaint of  Chief Complaint  Patient presents with   Emesis  .  History of Present Illness: 30 year old admitted through the emergency room with abdominal pain and felt to have pyelonephritis.  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  Patient was admitted to medical services and placed on antibiotics.  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  Patient continues to have abdominal pain this morning.  She describes this pain in the right upper quadrant and is very difficult for her to get comfortable.  She has no back pain or lower abdominal pain.  Current Facility-Administered Medications  Medication Dose Route Frequency Provider Last Rate Last Admin   acetaminophen (TYLENOL) tablet 1,000 mg  1,000 mg Oral Q6H PRN Cox, Amy N, DO   1,000 mg at 03/28/21 1019   Or   acetaminophen (TYLENOL) suppository 650 mg  650 mg Rectal Q6H PRN Cox, Amy N, DO       cefTRIAXone (ROCEPHIN) 2 g in sodium chloride 0.9 % 100 mL IVPB  2 g Intravenous Q24H Mansy, Jan A, MD 200 mL/hr at 03/28/21 0629 2 g at 03/28/21 4098   ferrous sulfate tablet 325 mg  325 mg Oral BID WC Cox, Amy N, DO   325 mg at 03/28/21 0846   heparin ADULT infusion 100 units/mL (25000 units/264mL)  1,900 Units/hr Intravenous Continuous Ronnald Ramp, RPH 19 mL/hr at 03/28/21 0845 1,900 Units/hr at 03/28/21 0845   labetalol (NORMODYNE) injection 5 mg  5 mg Intravenous Q3H PRN Cox, Amy N, DO       ondansetron (ZOFRAN) injection 4 mg  4 mg Intravenous Q6H PRN Cox, Amy N, DO   4 mg at 03/28/21 0518   Or   ondansetron (ZOFRAN) tablet 4 mg  4 mg Oral Q6H PRN Cox, Amy N, DO   4 mg at 03/27/21 2325   promethazine (PHENERGAN) 12.5 mg in sodium chloride 0.9 % 50 mL IVPB  12.5 mg Intravenous Q6H PRN Cox, Amy N, DO   Stopped at 03/27/21 1913     Past Medical History:  Diagnosis Date   Asthma    BMI 40.0-44.9, adult (HCC)    History of blood transfusion 2019   Recurrent UTI     Past Surgical History:  Procedure Laterality Date   CESAREAN SECTION     HAND SURGERY      Social History Social History   Tobacco Use   Smoking status: Never   Smokeless tobacco: Never  Vaping Use   Vaping Use: Never used  Substance Use Topics   Alcohol use: Yes    Alcohol/week: 2.0 standard drinks    Types: 2 Shots of liquor per week    Comment: 3-4x/mo   Drug use: Yes    Types: Marijuana    Comment: last use 12/2020    Family History Family History  Problem Relation Age of Onset   Hypertension Mother     No Known Allergies   REVIEW OF SYSTEMS (Negative unless checked)  Constitutional: [] Weight loss  [] Fever  [] Chills Cardiac: [] Chest pain   [] Chest pressure   [] Palpitations   [] Shortness of breath when laying flat   [] Shortness of breath at rest   [] Shortness of breath with exertion. Vascular:  [] Pain in legs with walking   [] Pain in legs  at rest   [] Pain in legs when laying flat   [] Claudication   [] Pain in feet when walking  [] Pain in feet at rest  [] Pain in feet when laying flat   [] History of DVT   [] Phlebitis   [] Swelling in legs   [] Varicose veins   [] Non-healing ulcers Pulmonary:   [] Uses home oxygen   [] Productive cough   [] Hemoptysis   [] Wheeze  [] COPD   [] Asthma Neurologic:  [] Dizziness  [] Blackouts   [] Seizures   [] History of stroke   [] History of TIA  [] Aphasia   [] Temporary blindness   [] Dysphagia   [] Weakness or numbness in arms   [] Weakness or numbness in legs Musculoskeletal:  [] Arthritis   [] Joint swelling   [] Joint pain   [] Low back pain Hematologic:  [] Easy bruising  [] Easy bleeding   [] Hypercoagulable state   [] Anemic  [] Hepatitis Gastrointestinal:  [] Blood in stool   [] Vomiting blood  [] Gastroesophageal reflux/heartburn   [] Difficulty swallowing. Genitourinary:  [] Chronic kidney disease   [] Difficult  urination  [] Frequent urination  [] Burning with urination   [] Blood in urine Skin:  [] Rashes   [] Ulcers   [] Wounds Psychological:  [] History of anxiety   []  History of major depression.  Physical Examination  Vitals:   03/27/21 2230 03/27/21 2329 03/28/21 0353 03/28/21 0758  BP: 106/79 (!) 100/55 120/71 116/69  Pulse: (!) 109 (!) 103 (!) 102 94  Resp: 20 18 18 16   Temp: 99.7 F (37.6 C) 98.6 F (37 C) 99.7 F (37.6 C) 98.6 F (37 C)  TempSrc: Oral Oral Oral Oral  SpO2: 99% 100% 98% 100%  Weight:      Height:       Body mass index is 42.03 kg/m.  Head: [place]/AT Ear/Nose/Throat: Hearing grossly intact, nares w/o erythema or drainage, oropharynx w/o Erythema/Exudate Eyes: PERRLA, EOMI.  Neck: Supple, no nuchal rigidity.  No JVD.  Pulmonary:  Good air movement, clear to auscultation bilaterally.  Cardiac: RRR, normal S1, S2, no Murmurs, rubs or gallops.  Vascular:   Vessel Right Left  Radial Palpable Palpable  Ulnar Palpable Palpable  Brachial Palpable Palpable  Carotid Palpable, without bruit Palpable, without bruit  Aorta Not palpable N/A  Femoral Palpable Palpable  Popliteal Palpable Palpable  PT Palpable Palpable  DP Palpable Palpable   Gastrointestinal: soft, non-tender/non-distended. No guarding/reflex. No masses, surgical incisions, or scars. Musculoskeletal: M/S 5/5 throughout.  Extremities without ischemic changes.  No deformity or atrophy. No edema. Neurologic: CN 2-12 intact. Pain and light touch intact in extremities.  Symmetrical.  Speech is fluent. Motor exam as listed above. Psychiatric: Judgment intact, Mood & affect appropriate for pt's clinical situation. Dermatologic: No rashes or ulcers noted.  No cellulitis or open wounds. Lymph : No Cervical, Axillary, or Inguinal lymphadenopathy.  Diagnostic Studies  CBC Lab Results  Component Value Date   WBC 13.6 (H) 03/28/2021   HGB 9.2 (L) 03/28/2021   HCT 28.6 (L) 03/28/2021   MCV 67.1 (L) 03/28/2021    PLT 175 03/28/2021    BMET    Component Value Date/Time   NA 138 03/28/2021 0543   NA 135 01/04/2017 0936   NA 134 (L) 12/06/2013 1907   K 3.6 03/28/2021 0543   K 3.3 (L) 12/06/2013 1907   CL 105 03/28/2021 0543   CL 104 12/06/2013 1907   CO2 24 03/28/2021 0543   CO2 25 12/06/2013 1907   GLUCOSE 121 (H) 03/28/2021 0543   GLUCOSE 83 12/06/2013 1907   BUN 9 03/28/2021 0543  BUN 5 (L) 01/04/2017 0936   BUN 6 (L) 12/06/2013 1907   CREATININE 0.79 03/28/2021 0543   CREATININE 0.65 12/06/2013 1907   CALCIUM 8.3 (L) 03/28/2021 0543   CALCIUM 9.3 12/06/2013 1907   GFRNONAA >60 03/28/2021 0543   GFRNONAA >60 12/06/2013 1907   GFRAA >60 07/22/2018 1257   GFRAA >60 12/06/2013 1907   Estimated Creatinine Clearance: 127.8 mL/min (by C-G formula based on SCr of 0.79 mg/dL).  COAG Lab Results  Component Value Date   INR 1.4 (H) 03/28/2021   INR 1.4 (H) 03/27/2021    Radiology CT scan shows ovarian vein thrombosis and inflammation consistent with pyelonephritis    Assessment/Plan 30 year old female with pyelonephritis and incidental findings of ovarian vein thrombosis.  This condition is typically seen in the postpartum period however can be related to infectious processes near about the ovarian veins.  Typical therapy is anticoagulation while on antibiotics until the pain subsides.  Afterwards we would suggest a 3 to 42-month course of oral anticoagulant and a CAT scan at that point.  On most instances these recannulize the ovarian veins.  No other therapy is indicated at this point time.   Cathleen Fears, MD Vascular and Endovascular Surgery  03/28/2021 2:22 PM

## 2021-03-28 NOTE — Plan of Care (Signed)
  Problem: Clinical Measurements: Goal: Ability to maintain clinical measurements within normal limits will improve Outcome: Progressing Goal: Will remain free from infection Outcome: Progressing Goal: Diagnostic test results will improve Outcome: Progressing Goal: Respiratory complications will improve Outcome: Progressing Goal: Cardiovascular complication will be avoided Outcome: Progressing   Problem: Pain Managment: Goal: General experience of comfort will improve Outcome: Progressing   Pt is involved in and agrees with the plan of care. V/S stable except HR in 100s-110s. Complained of headache and nausea&dry-retching. Tylenol and Zofran given.

## 2021-03-28 NOTE — Progress Notes (Signed)
Obstetric and Gynecology  Subjective  Still some back pain but improved over admissions, no chills or rigors.  Overall significant improvement since admission Objective  Vital signs in last 24 hours: Temp:  [98.4 F (36.9 C)-99.9 F (37.7 C)] 98.6 F (37 C) (08/14 0758) Pulse Rate:  [91-132] 94 (08/14 0758) Resp:  [15-20] 16 (08/14 0758) BP: (100-151)/(55-94) 116/69 (08/14 0758) SpO2:  [98 %-100 %] 100 % (08/14 0758) Weight:  [111.1 kg] 111.1 kg (08/13 2000)     Intake/Output Summary (Last 24 hours) at 03/28/2021 1302 Last data filed at 03/28/2021 0900 Gross per 24 hour  Intake 1271.18 ml  Output --  Net 1271.18 ml    General: NAD Pulmonary:no increased work of breathing Abdomen: soft, non-tender, non-distended Extremities: no edema  Labs: Results for orders placed or performed during the hospital encounter of 03/27/21 (from the past 24 hour(s))  CBC with Differential     Status: Abnormal   Collection Time: 03/27/21  1:28 PM  Result Value Ref Range   WBC 18.3 (H) 4.0 - 10.5 K/uL   RBC 4.86 3.87 - 5.11 MIL/uL   Hemoglobin 10.6 (L) 12.0 - 15.0 g/dL   HCT 84.6 (L) 96.2 - 95.2 %   MCV 68.7 (L) 80.0 - 100.0 fL   MCH 21.8 (L) 26.0 - 34.0 pg   MCHC 31.7 30.0 - 36.0 g/dL   RDW 84.1 (H) 32.4 - 40.1 %   Platelets 219 150 - 400 K/uL   nRBC 0.0 0.0 - 0.2 %   Neutrophils Relative % 92 %   Neutro Abs 16.8 (H) 1.7 - 7.7 K/uL   Lymphocytes Relative 2 %   Lymphs Abs 0.4 (L) 0.7 - 4.0 K/uL   Monocytes Relative 5 %   Monocytes Absolute 0.9 0.1 - 1.0 K/uL   Eosinophils Relative 0 %   Eosinophils Absolute 0.0 0.0 - 0.5 K/uL   Basophils Relative 0 %   Basophils Absolute 0.1 0.0 - 0.1 K/uL   WBC Morphology MORPHOLOGY UNREMARKABLE    RBC Morphology MORPHOLOGY UNREMARKABLE    Smear Review PLATELET CLUMPS NOTED ON SMEAR, UNABLE TO ESTIMATE    Immature Granulocytes 1 %   Abs Immature Granulocytes 0.14 (H) 0.00 - 0.07 K/uL  Comprehensive metabolic panel     Status: Abnormal    Collection Time: 03/27/21  1:28 PM  Result Value Ref Range   Sodium 134 (L) 135 - 145 mmol/L   Potassium 3.1 (L) 3.5 - 5.1 mmol/L   Chloride 103 98 - 111 mmol/L   CO2 22 22 - 32 mmol/L   Glucose, Bld 130 (H) 70 - 99 mg/dL   BUN 10 6 - 20 mg/dL   Creatinine, Ser 0.27 0.44 - 1.00 mg/dL   Calcium 8.7 (L) 8.9 - 10.3 mg/dL   Total Protein 7.9 6.5 - 8.1 g/dL   Albumin 3.6 3.5 - 5.0 g/dL   AST 20 15 - 41 U/L   ALT 21 0 - 44 U/L   Alkaline Phosphatase 59 38 - 126 U/L   Total Bilirubin 1.6 (H) 0.3 - 1.2 mg/dL   GFR, Estimated >25 >36 mL/min   Anion gap 9 5 - 15  Lipase, blood     Status: None   Collection Time: 03/27/21  1:28 PM  Result Value Ref Range   Lipase 20 11 - 51 U/L  Resp Panel by RT-PCR (Flu A&B, Covid) Nasopharyngeal Swab     Status: None   Collection Time: 03/27/21  1:28 PM   Specimen:  Nasopharyngeal Swab; Nasopharyngeal(NP) swabs in vial transport medium  Result Value Ref Range   SARS Coronavirus 2 by RT PCR NEGATIVE NEGATIVE   Influenza A by PCR NEGATIVE NEGATIVE   Influenza B by PCR NEGATIVE NEGATIVE  Urinalysis, Complete w Microscopic     Status: Abnormal   Collection Time: 03/27/21  3:08 PM  Result Value Ref Range   Color, Urine YELLOW (A) YELLOW   APPearance CLOUDY (A) CLEAR   Specific Gravity, Urine 1.003 (L) 1.005 - 1.030   pH 7.0 5.0 - 8.0   Glucose, UA NEGATIVE NEGATIVE mg/dL   Hgb urine dipstick MODERATE (A) NEGATIVE   Bilirubin Urine NEGATIVE NEGATIVE   Ketones, ur 5 (A) NEGATIVE mg/dL   Protein, ur 30 (A) NEGATIVE mg/dL   Nitrite NEGATIVE NEGATIVE   Leukocytes,Ua LARGE (A) NEGATIVE   RBC / HPF 0-5 0 - 5 RBC/hpf   WBC, UA >50 (H) 0 - 5 WBC/hpf   Bacteria, UA MANY (A) NONE SEEN   Squamous Epithelial / LPF 0-5 0 - 5   Mucus PRESENT   Culture, blood (routine x 2)     Status: None (Preliminary result)   Collection Time: 03/27/21  3:08 PM   Specimen: BLOOD  Result Value Ref Range   Specimen Description      BLOOD RIGHT ANTECUBITAL Performed at [facility], [address] [place], [place] [address]    Special Requests      BOTTLES DRAWN AEROBIC AND ANAEROBIC Blood Culture adequate volume Performed at [facility], [address] [place], [place] [address]    Culture  Setup Time      GRAM NEGATIVE RODS ANAEROBIC BOTTLE ONLY CRITICAL VALUE NOTED.  VALUE IS CONSISTENT WITH PREVIOUSLY REPORTED AND CALLED VALUE. Performed at [facility], [address], [place], [place] [address]    Culture GRAM NEGATIVE RODS    Report Status PENDING   Culture, blood (routine x 2)     Status: None (Preliminary result)   Collection Time: 03/27/21  3:08 PM   Specimen: BLOOD  Result Value Ref Range   Specimen Description BLOOD BLOOD RIGHT FOREARM    Special Requests      BOTTLES DRAWN AEROBIC AND ANAEROBIC Blood Culture results may not be optimal due to an inadequate volume of blood received in culture bottles   Culture  Setup Time      Organism ID to follow GRAM NEGATIVE RODS IN BOTH AEROBIC AND ANAEROBIC BOTTLES CRITICAL RESULT CALLED TO, READ BACK BY AND VERIFIED WITHDawayne Cirri @ 0348 03/28/2021 LFD Performed at [facility], [address] [place], [place] [address]    Culture GRAM NEGATIVE RODS    Report Status PENDING   Lactic acid, plasma     Status: None   Collection Time: 03/27/21  3:08 PM  Result Value Ref Range   Lactic Acid, Venous 1.1 0.5 - 1.9 mmol/L  Chlamydia/NGC rt PCR ([facility] only)     Status: None   Collection Time: 03/27/21  3:08 PM   Specimen: Cervical/Vaginal swab  Result Value Ref Range   Specimen source GC/Chlam URINE, RANDOM    Chlamydia Tr NOT DETECTED NOT DETECTED   N gonorrhoeae NOT DETECTED NOT DETECTED  Blood Culture ID Panel (Reflexed)     Status: Abnormal   Collection Time: 03/27/21  3:08 PM  Result Value Ref Range   Enterococcus faecalis NOT DETECTED NOT DETECTED   Enterococcus Faecium NOT DETECTED NOT DETECTED   Listeria monocytogenes NOT  DETECTED NOT DETECTED    Staphylococcus species NOT DETECTED NOT DETECTED   Staphylococcus aureus (BCID) NOT DETECTED NOT DETECTED   Staphylococcus epidermidis NOT DETECTED NOT DETECTED   Staphylococcus lugdunensis NOT DETECTED NOT DETECTED   Streptococcus species NOT DETECTED NOT DETECTED   Streptococcus agalactiae NOT DETECTED NOT DETECTED   Streptococcus pneumoniae NOT DETECTED NOT DETECTED   Streptococcus pyogenes NOT DETECTED NOT DETECTED   A.calcoaceticus-baumannii NOT DETECTED NOT DETECTED   Bacteroides fragilis NOT DETECTED NOT DETECTED   Enterobacterales DETECTED (A) NOT DETECTED   Enterobacter cloacae complex NOT DETECTED NOT DETECTED   Escherichia coli DETECTED (A) NOT DETECTED   Klebsiella aerogenes NOT DETECTED NOT DETECTED   Klebsiella oxytoca NOT DETECTED NOT DETECTED   Klebsiella pneumoniae NOT DETECTED NOT DETECTED   Proteus species NOT DETECTED NOT DETECTED   Salmonella species NOT DETECTED NOT DETECTED   Serratia marcescens NOT DETECTED NOT DETECTED   Haemophilus influenzae NOT DETECTED NOT DETECTED   Neisseria meningitidis NOT DETECTED NOT DETECTED   Pseudomonas aeruginosa NOT DETECTED NOT DETECTED   Stenotrophomonas maltophilia NOT DETECTED NOT DETECTED   Candida albicans NOT DETECTED NOT DETECTED   Candida auris NOT DETECTED NOT DETECTED   Candida glabrata NOT DETECTED NOT DETECTED   Candida krusei NOT DETECTED NOT DETECTED   Candida parapsilosis NOT DETECTED NOT DETECTED   Candida tropicalis NOT DETECTED NOT DETECTED   Cryptococcus neoformans/gattii NOT DETECTED NOT DETECTED   CTX-M ESBL NOT DETECTED NOT DETECTED   Carbapenem resistance IMP NOT DETECTED NOT DETECTED   Carbapenem resistance KPC NOT DETECTED NOT DETECTED   Carbapenem resistance NDM NOT DETECTED NOT DETECTED   Carbapenem resist OXA 48 LIKE NOT DETECTED NOT DETECTED   Carbapenem resistance VIM NOT DETECTED NOT DETECTED  POC urine preg, ED     Status: None   Collection Time: 03/27/21  3:21 PM  Result Value Ref Range    Preg Test, Ur Negative Negative  APTT     Status: None   Collection Time: 03/27/21  5:08 PM  Result Value Ref Range   aPTT 32 24 - 36 seconds  Protime-INR     Status: Abnormal   Collection Time: 03/27/21  5:08 PM  Result Value Ref Range   Prothrombin Time 16.9 (H) 11.4 - 15.2 seconds   INR 1.4 (H) 0.8 - 1.2  Heparin level (unfractionated)     Status: Abnormal   Collection Time: 03/27/21 10:39 PM  Result Value Ref Range   Heparin Unfractionated 0.16 (L) 0.30 - 0.70 IU/mL  Magnesium     Status: None   Collection Time: 03/27/21 10:39 PM  Result Value Ref Range   Magnesium 1.7 1.7 - 2.4 mg/dL  Procalcitonin     Status: None   Collection Time: 03/27/21 10:39 PM  Result Value Ref Range   Procalcitonin 34.94 ng/mL  TSH     Status: None   Collection Time: 03/27/21 10:39 PM  Result Value Ref Range   TSH 1.554 0.350 - 4.500 uIU/mL  Protime-INR     Status: Abnormal   Collection Time: 03/28/21  5:43 AM  Result Value Ref Range   Prothrombin Time 17.1 (H) 11.4 - 15.2 seconds   INR 1.4 (H) 0.8 - 1.2  Cortisol-am, blood     Status: Abnormal   Collection Time: 03/28/21  5:43 AM  Result Value Ref Range   Cortisol - AM 31.8 (H) 6.7 - 22.6 ug/dL  CBC     Status: Abnormal   Collection Time: 03/28/21  5:43 AM  Result Value Ref Range   WBC 13.6 (H) 4.0 - 10.5 K/uL   RBC 4.26 3.87 - 5.11 MIL/uL   Hemoglobin 9.2 (L) 12.0 - 15.0 g/dL   HCT 78.228.6 (L) 95.636.0 - 21.346.0 %   MCV 67.1 (L) 80.0 - 100.0 fL   MCH 21.6 (L) 26.0 - 34.0 pg   MCHC 32.2 30.0 - 36.0 g/dL   RDW 08.618.5 (H) 57.811.5 - 46.915.5 %   Platelets 175 150 - 400 K/uL   nRBC 0.0 0.0 - 0.2 %  Basic metabolic panel     Status: Abnormal   Collection Time: 03/28/21  5:43 AM  Result Value Ref Range   Sodium 138 135 - 145 mmol/L   Potassium 3.6 3.5 - 5.1 mmol/L   Chloride 105 98 - 111 mmol/L   CO2 24 22 - 32 mmol/L   Glucose, Bld 121 (H) 70 - 99 mg/dL   BUN 9 6 - 20 mg/dL   Creatinine, Ser 6.290.79 0.44 - 1.00 mg/dL   Calcium 8.3 (L) 8.9 - 10.3 mg/dL    GFR, Estimated >52>60 >84>60 mL/min   Anion gap 9 5 - 15  Heparin level (unfractionated)     Status: Abnormal   Collection Time: 03/28/21  5:43 AM  Result Value Ref Range   Heparin Unfractionated 0.13 (L) 0.30 - 0.70 IU/mL    Cultures: Results for orders placed or performed during the hospital encounter of 03/27/21  Resp Panel by RT-PCR (Flu A&B, Covid) Nasopharyngeal Swab     Status: None   Collection Time: 03/27/21  1:28 PM   Specimen: Nasopharyngeal Swab; Nasopharyngeal(NP) swabs in vial transport medium  Result Value Ref Range Status   SARS Coronavirus 2 by RT PCR NEGATIVE NEGATIVE Final    Comment: (NOTE) SARS-CoV-2 target nucleic acids are NOT DETECTED.  The SARS-CoV-2 RNA is generally detectable in upper respiratory specimens during the acute phase of infection. The lowest concentration of SARS-CoV-2 viral copies this assay can detect is 138 copies/mL. A negative result does not preclude SARS-Cov-2 infection and should not be used as the sole basis for treatment or other patient management decisions. A negative result may occur with  improper specimen collection/handling, submission of specimen other than nasopharyngeal swab, presence of viral mutation(s) within the areas targeted by this assay, and inadequate number of viral copies(<138 copies/mL). A negative result must be combined with clinical observations, patient history, and epidemiological information. The expected result is Negative.  Fact Sheet for Patients:  BloggerCourse.comhttps://www.fda.gov/media/152166/download  Fact Sheet for Healthcare Providers:  SeriousBroker.ithttps://www.fda.gov/media/152162/download  This test is no t yet approved or cleared by the [place] FDA and  has been authorized for detection and/or diagnosis of SARS-CoV-2 by FDA under an Emergency Use Authorization (EUA). This EUA will remain  in effect (meaning this test can be used) for the duration of the COVID-19 declaration under Section 564(b)(1) of the Act,  21 U.S.C.section 360bbb-3(b)(1), unless the authorization is terminated  or revoked sooner.       Influenza A by PCR NEGATIVE NEGATIVE Final   Influenza B by PCR NEGATIVE NEGATIVE Final    Comment: (NOTE) The Xpert Xpress SARS-CoV-2/FLU/RSV plus assay is intended as an aid in the diagnosis of influenza from Nasopharyngeal swab specimens and should not be used as a sole basis for treatment. Nasal washings and aspirates are unacceptable for Xpert Xpress SARS-CoV-2/FLU/RSV testing.  Fact Sheet for Patients: BloggerCourse.comhttps://www.fda.gov/media/152166/download  Fact Sheet for Healthcare Providers: SeriousBroker.ithttps://www.fda.gov/media/152162/download  This test is not yet approved or cleared by the  [place] [employer] and has been authorized for detection and/or diagnosis of SARS-CoV-2 by FDA under an TEFL teacher (EUA). This EUA will remain in effect (meaning this test can be used) for the duration of the COVID-19 declaration under Section 564(b)(1) of the Act, 21 U.S.C. section 360bbb-3(b)(1), unless the authorization is terminated or revoked.  Performed at [facility], [address] [place], [place] [address]   Culture, blood (routine x 2)     Status: None (Preliminary result)   Collection Time: 03/27/21  3:08 PM   Specimen: BLOOD  Result Value Ref Range Status   Specimen Description   Final    BLOOD RIGHT ANTECUBITAL Performed at [facility], [address], [place], [place] [address]    Special Requests   Final    BOTTLES DRAWN AEROBIC AND ANAEROBIC Blood Culture adequate volume Performed at [facility], [address], [place], [place] [address]    Culture  Setup Time   Final    GRAM NEGATIVE RODS ANAEROBIC BOTTLE ONLY CRITICAL VALUE NOTED.  VALUE IS CONSISTENT WITH PREVIOUSLY REPORTED AND CALLED VALUE. Performed at [facility], [address], [place], [place] [address]    Culture GRAM NEGATIVE RODS  Final   Report Status PENDING   Incomplete  Culture, blood (routine x 2)     Status: None (Preliminary result)   Collection Time: 03/27/21  3:08 PM   Specimen: BLOOD  Result Value Ref Range Status   Specimen Description BLOOD BLOOD RIGHT FOREARM  Final   Special Requests   Final    BOTTLES DRAWN AEROBIC AND ANAEROBIC Blood Culture results may not be optimal due to an inadequate volume of blood received in culture bottles   Culture  Setup Time   Final    Organism ID to follow GRAM NEGATIVE RODS IN BOTH AEROBIC AND ANAEROBIC BOTTLES CRITICAL RESULT CALLED TO, READ BACK BY AND VERIFIED WITHDawayne Cirri @ 0348 03/28/2021 LFD Performed at [facility], [address], [place], [place] [address]    Culture GRAM NEGATIVE RODS  Final   Report Status PENDING  Incomplete  Chlamydia/NGC rt PCR ([facility] only)     Status: None   Collection Time: 03/27/21  3:08 PM   Specimen: Cervical/Vaginal swab  Result Value Ref Range Status   Specimen source GC/Chlam URINE, RANDOM  Final   Chlamydia Tr NOT DETECTED NOT DETECTED Final   N gonorrhoeae NOT DETECTED NOT DETECTED Final    Comment: (NOTE) This CT/NG assay has not been evaluated in patients with a history of  hysterectomy. Performed at [facility], [address] [place], [place] [address]   Blood Culture ID Panel (Reflexed)     Status: Abnormal   Collection Time: 03/27/21  3:08 PM  Result Value Ref Range Status   Enterococcus faecalis NOT DETECTED NOT DETECTED Final   Enterococcus Faecium NOT DETECTED NOT DETECTED Final   Listeria monocytogenes NOT DETECTED NOT DETECTED Final   Staphylococcus species NOT DETECTED NOT DETECTED Final   Staphylococcus aureus (BCID) NOT DETECTED NOT DETECTED Final   Staphylococcus epidermidis NOT DETECTED NOT DETECTED Final   Staphylococcus lugdunensis NOT DETECTED NOT DETECTED Final   Streptococcus species NOT DETECTED NOT DETECTED Final   Streptococcus agalactiae NOT DETECTED NOT DETECTED Final   Streptococcus  pneumoniae NOT DETECTED NOT DETECTED Final   Streptococcus pyogenes NOT DETECTED NOT DETECTED Final   A.calcoaceticus-baumannii NOT DETECTED NOT DETECTED Final   Bacteroides fragilis NOT DETECTED NOT DETECTED  Final   Enterobacterales DETECTED (A) NOT DETECTED Final    Comment: Enterobacterales represent a large order of gram negative bacteria, not a single organism. CRITICAL RESULT CALLED TO, READ BACK BY AND VERIFIED WITH: NATHAN BELUE @ 0348 03/28/21 LFD     Enterobacter cloacae complex NOT DETECTED NOT DETECTED Final   Escherichia coli DETECTED (A) NOT DETECTED Final    Comment: CRITICAL RESULT CALLED TO, READ BACK BY AND VERIFIED WITH: NATHAN BELUE @ 0348 03/28/21 LFD    Klebsiella aerogenes NOT DETECTED NOT DETECTED Final   Klebsiella oxytoca NOT DETECTED NOT DETECTED Final   Klebsiella pneumoniae NOT DETECTED NOT DETECTED Final   Proteus species NOT DETECTED NOT DETECTED Final   Salmonella species NOT DETECTED NOT DETECTED Final   Serratia marcescens NOT DETECTED NOT DETECTED Final   Haemophilus influenzae NOT DETECTED NOT DETECTED Final   Neisseria meningitidis NOT DETECTED NOT DETECTED Final   Pseudomonas aeruginosa NOT DETECTED NOT DETECTED Final   Stenotrophomonas maltophilia NOT DETECTED NOT DETECTED Final   Candida albicans NOT DETECTED NOT DETECTED Final   Candida auris NOT DETECTED NOT DETECTED Final   Candida glabrata NOT DETECTED NOT DETECTED Final   Candida krusei NOT DETECTED NOT DETECTED Final   Candida parapsilosis NOT DETECTED NOT DETECTED Final   Candida tropicalis NOT DETECTED NOT DETECTED Final   Cryptococcus neoformans/gattii NOT DETECTED NOT DETECTED Final   CTX-M ESBL NOT DETECTED NOT DETECTED Final   Carbapenem resistance IMP NOT DETECTED NOT DETECTED Final   Carbapenem resistance KPC NOT DETECTED NOT DETECTED Final   Carbapenem resistance NDM NOT DETECTED NOT DETECTED Final   Carbapenem resist OXA 48 LIKE NOT DETECTED NOT DETECTED Final   Carbapenem  resistance VIM NOT DETECTED NOT DETECTED Final    Comment: Performed at [facility], [address] [place], [place] [address]    Imaging:  Assessment   30 y.o. 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

## 2021-03-28 NOTE — Progress Notes (Signed)
ANTICOAGULATION CONSULT NOTE  Pharmacy Consult for heparin infusion Indication: ovarian vein thrombosis  No Known Allergies  Patient Measurements: Height: 5' 4.02" (162.6 cm) Weight: 111.1 kg (245 lb) IBW/kg (Calculated) : 54.74 Heparin Dosing Weight: 81.4 kg  Vital Signs: Temp: 98.6 F (37 C) (08/14 0758) Temp Source: Oral (08/14 0758) BP: 116/69 (08/14 0758) Pulse Rate: 94 (08/14 0758)  Labs: Recent Labs    03/26/21 1934 03/27/21 1328 03/27/21 1708 03/27/21 2239 03/28/21 0543 03/28/21 1436  HGB 11.3* 10.6*  --   --  9.2*  --   HCT 36.4 33.4*  --   --  28.6*  --   PLT 232 219  --   --  175  --   APTT  --   --  32  --   --   --   LABPROT  --   --  16.9*  --  17.1*  --   INR  --   --  1.4*  --  1.4*  --   HEPARINUNFRC  --   --   --  0.16* 0.13* 0.14*  CREATININE 0.65 0.89  --   --  0.79  --      Estimated Creatinine Clearance: 127.8 mL/min (by C-G formula based on SCr of 0.79 mg/dL).   Medical History: Past Medical History:  Diagnosis Date   Asthma    BMI 40.0-44.9, adult (HCC)    History of blood transfusion 2019   Recurrent UTI     Medications:  Spoke with patient and confirmed she is not taking any anticoagulation  Assessment: 30 yo female  with past medical history of asthma and recurrent UTIs who presented to the ED complaining of nausea and vomiting. Pt also complained of right upper quadrant pain of her abdomen. Pharmacy has been consulted for heparin dosing and monitoring.   8/13 CT abdomen: findings consistent with R and possible L ovarian vein thrombosis   Goal of Therapy:  Heparin level 0.3-0.7 units/ml Monitor platelets by anticoagulation protocol: Yes  08/13 2239 HL 0.16, subtherapeutic 08/14 0543 HL 0.13, subtherapeutic  8/14 1436 HL 0.14, subtherapeutic   Plan:  Heparin level is subtherapeutic. Will give 4000 units bolus x 1 and increase the heparin infusion 2100 units/hr.Giving larger bolus due to potential resistance. Recheck  heparin level in 6 hours. CBC daily while on heparin.      Jaynie Bream, PharmD Pharmacy Resident  03/28/2021 3:30 PM

## 2021-03-28 NOTE — Progress Notes (Signed)
ANTICOAGULATION CONSULT NOTE  Pharmacy Consult for heparin infusion Indication: ovarian vein thrombosis  No Known Allergies  Patient Measurements: Height: 5' 4.02" (162.6 cm) Weight: 111.1 kg (245 lb) IBW/kg (Calculated) : 54.74 Heparin Dosing Weight: 81.4 kg  Vital Signs: Temp: 98.6 F (37 C) (08/13 2329) Temp Source: Oral (08/13 2329) BP: 100/55 (08/13 2329) Pulse Rate: 103 (08/13 2329)  Labs: Recent Labs    03/26/21 1934 03/27/21 1328 03/27/21 1708 03/27/21 2239  HGB 11.3* 10.6*  --   --   HCT 36.4 33.4*  --   --   PLT 232 219  --   --   APTT  --   --  32  --   LABPROT  --   --  16.9*  --   INR  --   --  1.4*  --   HEPARINUNFRC  --   --   --  0.16*  CREATININE 0.65 0.89  --   --      Estimated Creatinine Clearance: 114.8 mL/min (by C-G formula based on SCr of 0.89 mg/dL).   Medical History: Past Medical History:  Diagnosis Date   Asthma    BMI 40.0-44.9, adult (HCC)    History of blood transfusion 2019   Recurrent UTI     Medications:  Spoke with patient and confirmed she is not taking any anticoagulation  Assessment: 30 yo female  with past medical history of asthma and recurrent UTIs who presented to the ED complaining of nausea and vomiting. Pt also complained of right upper quadrant pain of her abdomen. Pharmacy has been consulted for heparin dosing and monitoring.   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   Goal of Therapy:  Heparin level 0.3-0.7 units/ml Monitor platelets by anticoagulation protocol: Yes  08/13 2239 HL 0.16, subtherapeutic   Plan:  Give 2400 units bolus x 1 Start heparin infusion at 1650 units/hr Recheck HL in 6 hr after rate change Continue to monitor H&H and platelets  Otelia Sergeant, PharmD, [facility] 03/28/2021 1:09 AM

## 2021-03-29 ENCOUNTER — Encounter: Payer: Self-pay | Admitting: Internal Medicine

## 2021-03-29 LAB — CBC
HCT: 26.4 % — ABNORMAL LOW (ref 36.0–46.0)
Hemoglobin: 8.3 g/dL — ABNORMAL LOW (ref 12.0–15.0)
MCH: 21.4 pg — ABNORMAL LOW (ref 26.0–34.0)
MCHC: 31.4 g/dL (ref 30.0–36.0)
MCV: 68.2 fL — ABNORMAL LOW (ref 80.0–100.0)
Platelets: 185 10*3/uL (ref 150–400)
RBC: 3.87 MIL/uL (ref 3.87–5.11)
RDW: 18.6 % — ABNORMAL HIGH (ref 11.5–15.5)
WBC: 9.7 10*3/uL (ref 4.0–10.5)
nRBC: 0 % (ref 0.0–0.2)

## 2021-03-29 LAB — ANA W/REFLEX IF POSITIVE: Anti Nuclear Antibody (ANA): NEGATIVE

## 2021-03-29 LAB — IRON AND TIBC
Iron: 27 ug/dL — ABNORMAL LOW (ref 28–170)
Saturation Ratios: 9 % — ABNORMAL LOW (ref 10.4–31.8)
TIBC: 309 ug/dL (ref 250–450)
UIBC: 282 ug/dL

## 2021-03-29 LAB — RETICULOCYTES
Immature Retic Fract: 17.6 % — ABNORMAL HIGH (ref 2.3–15.9)
RBC.: 3.8 MIL/uL — ABNORMAL LOW (ref 3.87–5.11)
Retic Count, Absolute: 28.1 10*3/uL (ref 19.0–186.0)
Retic Ct Pct: 0.7 % (ref 0.4–3.1)

## 2021-03-29 LAB — FERRITIN: Ferritin: 45 ng/mL (ref 11–307)

## 2021-03-29 LAB — FOLATE: Folate: 6.9 ng/mL (ref >5.9)

## 2021-03-29 LAB — VITAMIN B12: Vitamin B-12: 909 pg/mL (ref 180–914)

## 2021-03-29 LAB — HEPARIN LEVEL (UNFRACTIONATED): Heparin Unfractionated: 0.14 IU/mL — ABNORMAL LOW (ref 0.30–0.70)

## 2021-03-29 MED ORDER — RIVAROXABAN 15 MG PO TABS
15.0000 mg | ORAL_TABLET | Freq: Two times a day (BID) | ORAL | Status: DC
  Administered 2021-03-29 – 2021-03-30 (×3): 15 mg via ORAL
  Filled 2021-03-29 (×4): qty 1

## 2021-03-29 MED ORDER — SODIUM CHLORIDE 0.9 % IV SOLN
INTRAVENOUS | Status: DC | PRN
  Administered 2021-03-29: 250 mL via INTRAVENOUS

## 2021-03-29 MED ORDER — HEPARIN BOLUS VIA INFUSION
2500.0000 [IU] | Freq: Once | INTRAVENOUS | Status: AC
  Administered 2021-03-29: 2500 [IU] via INTRAVENOUS
  Filled 2021-03-29: qty 2500

## 2021-03-29 MED ORDER — RIVAROXABAN 20 MG PO TABS: 20.0000 mg | ORAL_TABLET | Freq: Every day | ORAL | Status: DC

## 2021-03-29 MED ORDER — SODIUM CHLORIDE 0.9 % IV SOLN
510.0000 mg | Freq: Once | INTRAVENOUS | Status: AC
  Administered 2021-03-29: 510 mg via INTRAVENOUS
  Filled 2021-03-29: qty 17

## 2021-03-29 NOTE — Progress Notes (Signed)
ANTICOAGULATION CONSULT NOTE  Pharmacy Consult for Xarelto Indication: ovarian vein thrombosis  No Known Allergies  Patient Measurements: Height: 5' 4.02" (162.6 cm) Weight: 111.1 kg (245 lb) IBW/kg (Calculated) : 54.74 Heparin Dosing Weight: 81.4 kg  Vital Signs: Temp: 98.9 F (37.2 C) (08/15 0406) Temp Source: Oral (08/15 0406) BP: 138/85 (08/15 0406) Pulse Rate: 95 (08/15 0406)  Labs: Recent Labs    03/26/21 1934 03/27/21 1328 03/27/21 1708 03/27/21 2239 03/28/21 0543 03/28/21 1436 03/28/21 2133 03/29/21 0554  HGB 11.3* 10.6*  --   --  9.2*  --   --  8.3*  HCT 36.4 33.4*  --   --  28.6*  --   --  26.4*  PLT 232 219  --   --  175  --   --  185  APTT  --   --  32  --   --   --   --   --   LABPROT  --   --  16.9*  --  17.1*  --   --   --   INR  --   --  1.4*  --  1.4*  --   --   --   HEPARINUNFRC  --   --   --    < > 0.13* 0.14* 0.12* 0.14*  CREATININE 0.65 0.89  --   --  0.79  --   --   --    < > = values in this interval not displayed.     Estimated Creatinine Clearance: 127.8 mL/min (by C-G formula based on SCr of 0.79 mg/dL).   Medical History: Past Medical History:  Diagnosis Date   Asthma    BMI 40.0-44.9, adult (HCC)    History of blood transfusion 2019   Recurrent UTI     Medications:  Spoke with patient and confirmed she is not taking any anticoagulation  Assessment: 30 yo female  with past medical history of asthma and recurrent UTIs who presented to the ED complaining of nausea and vomiting. Pt also complained of right upper quadrant pain of her abdomen. Pharmacy has been consulted for heparin dosing and monitoring.   8/13 CT abdomen: findings consistent with R and possible L ovarian vein thrombosis   8/15 consulted for transition to Xarelto. Hgb declining today, though patient has PMH of anemia with menorrhagia. Platelets stable.   Goal of Therapy:  Monitor platelets by anticoagulation protocol: Yes  08/13 2239 HL 0.16,  subtherapeutic 08/14 0543 HL 0.13, subtherapeutic  08/14 1436 HL 0.14, subtherapeutic 08/14 2133 HL 0.12, subtherapeutic 08/15 0554 HL 0.14, subtherapeutic 8/15 1030: Heparin discontinued   Plan:  Discontinue heparin gtt. Start Xarelto 15 mg BID x 21 days followed by 20 mg daily. CBC daily.   Jaynie Bream, PharmD Pharmacy Resident  03/29/2021 9:19 AM

## 2021-03-29 NOTE — Progress Notes (Signed)
ANTICOAGULATION CONSULT NOTE  Pharmacy Consult for heparin infusion Indication: ovarian vein thrombosis  No Known Allergies  Patient Measurements: Height: 5' 4.02" (162.6 cm) Weight: 111.1 kg (245 lb) IBW/kg (Calculated) : 54.74 Heparin Dosing Weight: 81.4 kg  Vital Signs: Temp: 98.9 F (37.2 C) (08/15 0406) Temp Source: Oral (08/15 0406) BP: 138/85 (08/15 0406) Pulse Rate: 95 (08/15 0406)  Labs: Recent Labs    03/26/21 1934 03/27/21 1328 03/27/21 1708 03/27/21 2239 03/28/21 0543 03/28/21 1436 03/28/21 2133 03/29/21 0554  HGB 11.3* 10.6*  --   --  9.2*  --   --  8.3*  HCT 36.4 33.4*  --   --  28.6*  --   --  26.4*  PLT 232 219  --   --  175  --   --  185  APTT  --   --  32  --   --   --   --   --   LABPROT  --   --  16.9*  --  17.1*  --   --   --   INR  --   --  1.4*  --  1.4*  --   --   --   HEPARINUNFRC  --   --   --    < > 0.13* 0.14* 0.12* 0.14*  CREATININE 0.65 0.89  --   --  0.79  --   --   --    < > = values in this interval not displayed.     Estimated Creatinine Clearance: 127.8 mL/min (by C-G formula based on SCr of 0.79 mg/dL).   Medical History: Past Medical History:  Diagnosis Date   Asthma    BMI 40.0-44.9, adult (HCC)    History of blood transfusion 2019   Recurrent UTI     Medications:  Spoke with patient and confirmed she is not taking any anticoagulation  Assessment: 30 yo female  with past medical history of asthma and recurrent UTIs who presented to the ED complaining of nausea and vomiting. Pt also complained of right upper quadrant pain of her abdomen. Pharmacy has been consulted for heparin dosing and monitoring.   8/13 CT abdomen: findings consistent with R and possible L ovarian vein thrombosis   Goal of Therapy:  Heparin level 0.3-0.7 units/ml Monitor platelets by anticoagulation protocol: Yes  08/13 2239 HL 0.16, subtherapeutic 08/14 0543 HL 0.13, subtherapeutic  08/14 1436 HL 0.14, subtherapeutic 08/14 2133 HL 0.12,  subtherapeutic 08/15 0554 HL 0.14, subtherapeutic   Plan:  Heparin level is subtherapeutic. Will give 2500 units bolus x 1 and increase the heparin infusion 2650 units/hr. Recheck heparin level in 6 hours after rate change. CBC daily while on heparin.   Otelia Sergeant, PharmD, [facility] 03/29/2021 6:37 AM

## 2021-03-29 NOTE — Progress Notes (Signed)
Nutrition Brief Note  Patient identified on the Malnutrition Screening Tool (MST) Report  30 y.o. female with medical history significant for C-section and successful VBAC, obesity, asthma and recurrent UTI who is admitted with UTI, pyelonephritis and bilateral ovarian vein thrombosis  Met with pt in room today. Pt up walking around in room and reports that she is feeling much better today. Pt reports good appetite and oral intake in hospital; pt's main complaint is that the food is bland. Per chart, pt appears weight stable at baseline. Pt reports that her UBW is around 240lbs. Pt declines any ONS today.   Wt Readings from Last 15 Encounters:  03/27/21 111.1 kg  03/26/21 111.9 kg  02/08/21 111.9 kg  01/12/21 108 kg  03/02/20 108.9 kg  02/17/19 111.1 kg  08/02/18 118.8 kg  07/22/18 111.1 kg  08/29/17 117 kg  07/31/17 114.3 kg  07/15/17 118.8 kg  07/11/17 118.8 kg  06/30/17 119.7 kg  06/23/17 118.8 kg  06/21/17 118.4 kg    Body mass index is 42.03 kg/m. Patient meets criteria for morbid obesity based on current BMI.   Current diet order is regular, patient is consuming approximately 50% of meals at this time. Labs and medications reviewed.   No nutrition interventions warranted at this time. If nutrition issues arise, please consult RD.   Koleen Distance MS, RD, LDN Please refer to [facility] for RD and/or RD on-call/weekend/after hours pager

## 2021-03-29 NOTE — Progress Notes (Signed)
PROGRESS NOTE    LAGINA READER  FAO:[id number] DOB: 1990-10-15 DOA: 03/27/2021 PCP: Patient, No Pcp Per (Inactive)   Brief Narrative: Taken from H&P. Rachel Clayton is a 30 y.o. female with medical history significant for history of C-section and successful VBAC, obesity, asthma, history of recurrent UTI, presents the emergency department for chief concerns of persistent nausea, vomiting, fever, chills.  Symptoms started on 8/11, initially presented to ED on 03/26/2021 but left without being seen due to prolonged wait time. Due to persistent nausea and vomiting and unable to keep anything down she came back to ED on 03/27/2021.  She was found to be febrile at 101, tachycardic at 112, mild tachypnea in the 20s, leukocytosis 18.3, hemoglobin of 10.6, UA with large leukocytes.  CT abdomen with concern of bilateral pyelonephritis, A hypodense lesion arising from upper left kidney which has been increased in size from a prior exam done in 2018.  They were recommending renal protocol MRI for better characterization after the resolution of acute symptoms. There was also right and possibly left ovarian vein thrombosis and mild edema adjacent to the left ovarian vein and some retroperitoneal inflammation.  Diverticulosis without any sign of diverticulitis.  Admitted with sepsis secondary to UTI and ovarian vein thrombosis.  OB/GYN and and vascular surgery was consulted.  Blood and urine cultures growing E. Coli.  She was started on heparin infusion, and today will be transitioned to Xarelto.  Subjective: Patient is feeling much improved today.  Pain seems well controlled.  Appetite improving.  Assessment & Plan:   Active Problems:   Menorrhagia with irregular cycle   Obesity BMI=42.3   Hirsutism   Sepsis ([place])   Essential hypertension   Thrombosis of ovarian vein   Pyelonephritis   RUQ pain  Sepsis secondary to UTI and pyelonephritis.  Patient met sepsis criteria with being febrile, leukocytosis,  tachycardia and tachypnea.  Received IV fluid in ED and was given broad-spectrum antibiotics with ceftriaxone and Flagyl. Blood and urine cultures growing E. coli.  CT abdomen without any sign of diverticulitis.  Urine cultures pending. Procalcitonin elevated at 34.94 OB/GYN sent for gonorrhea and chlamydia which came back negative. Flagyl was discontinued. -Continue with ceftriaxone -Follow-up culture susceptibility-we will need 2 weeks of antibiotics. -Continue supportive care  Bilateral ovarian vein thrombosis.  Can be due to sepsis, little unusual as patient was not on any oral contraceptives and most recent pregnancy was in 2018 which are the most common causes. Per vascular surgery she will need anticoagulation for at least 3-6 months depending on her labs and further work-up. -Will need hypercoagulable and malignant work-up-ordered which includes prothrombin gene mutation, Antithrombin panel, factor V Leyden panel, antiphospholipid antibody and ANA. -Switch her to Xarelto today. -Appreciate vascular surgery recommendations.  Menorrhagia.  OB/GYN was also consulted and they are recommending hormonal IUD which can be done as an outpatient.  Anemia.  Most likely secondary to menorrhagia.  Anemia panel with iron deficiency -Give her 1 dose of Feraheme -Continue with home iron supplement. -Monitor hemoglobin -Transfuse if below 7  Left kidney hypodense lesion.  Patient will need a renal MRI once improved from current infection. PCP should be able to obtain it.  Elevated blood pressure on admission.  Most likely secondary to pain as blood pressure improved with pain control.  Stage III obesity. Estimated body mass index is 42.03 kg/m as calculated from the following:   Height as of this encounter: 5' 4.02" (1.626 m).   Weight as of  this encounter: 111.1 kg.  Patient needs continuation of counseling.  Hirsutism.  Thick hairs on abdomen, chin etc. -Will need OB/GYN work-up as an  outpatient -We will check A1c  Objective: Vitals:   03/28/21 2223 03/29/21 0406 03/29/21 0952 03/29/21 1539  BP: 128/80 138/85 129/88 129/87  Pulse: 81 95 80 78  Resp: '19 16 16 18  ' Temp: 98.9 F (37.2 C) 98.9 F (37.2 C) 98.7 F (37.1 C) 97.7 F (36.5 C)  TempSrc:  Oral  Oral  SpO2: 99% 99% 93% 100%  Weight:      Height:        Intake/Output Summary (Last 24 hours) at 03/29/2021 1710 Last data filed at 03/29/2021 1656 Gross per 24 hour  Intake 1465.1 ml  Output --  Net 1465.1 ml    Filed Weights   03/27/21 2000  Weight: 111.1 kg    Examination:  General.  Well-developed obese lady, in no acute distress. Pulmonary.  Lungs clear bilaterally, normal respiratory effort. CV.  Regular rate and rhythm, no JVD, rub or murmur. Abdomen.  Soft, nontender, nondistended, BS positive. CNS.  Alert and oriented x3.  No focal neurologic deficit. Extremities.  No edema, no cyanosis, pulses intact and symmetrical. Psychiatry.  Judgment and insight appears normal.   DVT prophylaxis: Heparin infusion Code Status: Full Family Communication: Discussed with patient Disposition Plan:  Status is: Inpatient  Remains inpatient appropriate because:Inpatient level of care appropriate due to severity of illness  Dispo: The patient is from: Home              Anticipated d/c is to: Home              Patient currently is not medically stable to d/c.   Difficult to place patient No               Level of care: Progressive Cardiac  All the records are reviewed and case discussed with Care Management/Social Worker. Management plans discussed with the patient, nursing and they are in agreement.  Consultants:  OB/GYN Vascular surgery  Procedures:  Antimicrobials:  Ceftriaxone  Data Reviewed: I have personally reviewed following labs and imaging studies  CBC: Recent Labs  Lab 03/26/21 1934 03/27/21 1328 03/28/21 0543 03/29/21 0554  WBC 16.2* 18.3* 13.6* 9.7  NEUTROABS  --  16.8*   --   --   HGB 11.3* 10.6* 9.2* 8.3*  HCT 36.4 33.4* 28.6* 26.4*  MCV 67.8* 68.7* 67.1* 68.2*  PLT 232 219 175 357    Basic Metabolic Panel: Recent Labs  Lab 03/26/21 1934 03/27/21 1328 03/27/21 2239 03/28/21 0543  NA 131* 134*  --  138  K 3.4* 3.1*  --  3.6  CL 100 103  --  105  CO2 21* 22  --  24  GLUCOSE 135* 130*  --  121*  BUN 8 10  --  9  CREATININE 0.65 0.89  --  0.79  CALCIUM 8.9 8.7*  --  8.3*  MG  --   --  1.7  --     GFR: Estimated Creatinine Clearance: 127.8 mL/min (by C-G formula based on SCr of 0.79 mg/dL). Liver Function Tests: Recent Labs  Lab 03/26/21 1934 03/27/21 1328  AST 19 20  ALT 18 21  ALKPHOS 57 59  BILITOT 1.3* 1.6*  PROT 8.0 7.9  ALBUMIN 4.0 3.6    Recent Labs  Lab 03/26/21 1934 03/27/21 1328  LIPASE 23 20    No results for input(s): AMMONIA in  the last 168 hours. Coagulation Profile: Recent Labs  Lab 03/27/21 1708 03/28/21 0543  INR 1.4* 1.4*    Cardiac Enzymes: No results for input(s): CKTOTAL, CKMB, CKMBINDEX, TROPONINI in the last 168 hours. BNP (last 3 results) No results for input(s): PROBNP in the last 8760 hours. HbA1C: Recent Labs    03/28/21 1436  HGBA1C 5.7*   CBG: No results for input(s): GLUCAP in the last 168 hours. Lipid Profile: No results for input(s): CHOL, HDL, LDLCALC, TRIG, CHOLHDL, LDLDIRECT in the last 72 hours. Thyroid Function Tests: Recent Labs    03/27/21 2239  TSH 1.554    Anemia Panel: Recent Labs    03/29/21 0554 03/29/21 1133  FOLATE  --  6.9  FERRITIN  --  45  TIBC  --  309  IRON  --  27*  RETICCTPCT 0.7  --    Sepsis Labs: Recent Labs  Lab 03/27/21 1508 03/27/21 2239  PROCALCITON  --  34.94  LATICACIDVEN 1.1  --      Recent Results (from the past 240 hour(s))  Resp Panel by RT-PCR (Flu A&B, Covid) Nasopharyngeal Swab     Status: None   Collection Time: 03/27/21  1:28 PM   Specimen: Nasopharyngeal Swab; Nasopharyngeal(NP) swabs in vial transport medium   Result Value Ref Range Status   SARS Coronavirus 2 by RT PCR NEGATIVE NEGATIVE Final    Comment: (NOTE) SARS-CoV-2 target nucleic acids are NOT DETECTED.  The SARS-CoV-2 RNA is generally detectable in upper respiratory specimens during the acute phase of infection. The lowest concentration of SARS-CoV-2 viral copies this assay can detect is 138 copies/mL. A negative result does not preclude SARS-Cov-2 infection and should not be used as the sole basis for treatment or other patient management decisions. A negative result may occur with  improper specimen collection/handling, submission of specimen other than nasopharyngeal swab, presence of viral mutation(s) within the areas targeted by this assay, and inadequate number of viral copies(<138 copies/mL). A negative result must be combined with clinical observations, patient history, and epidemiological information. The expected result is Negative.  Fact Sheet for Patients:  EntrepreneurPulse.com.au  Fact Sheet for Healthcare Providers:  IncredibleEmployment.be  This test is no t yet approved or cleared by the [place] FDA and  has been authorized for detection and/or diagnosis of SARS-CoV-2 by FDA under an Emergency Use Authorization (EUA). This EUA will remain  in effect (meaning this test can be used) for the duration of the COVID-19 declaration under Section 564(b)(1) of the Act, 21 U.S.C.section 360bbb-3(b)(1), unless the authorization is terminated  or revoked sooner.       Influenza A by PCR NEGATIVE NEGATIVE Final   Influenza B by PCR NEGATIVE NEGATIVE Final    Comment: (NOTE) The Xpert Xpress SARS-CoV-2/FLU/RSV plus assay is intended as an aid in the diagnosis of influenza from Nasopharyngeal swab specimens and should not be used as a sole basis for treatment. Nasal washings and aspirates are unacceptable for Xpert Xpress SARS-CoV-2/FLU/RSV testing.  Fact Sheet for  Patients: EntrepreneurPulse.com.au  Fact Sheet for Healthcare Providers: IncredibleEmployment.be  This test is not yet approved or cleared by the [place] FDA and has been authorized for detection and/or diagnosis of SARS-CoV-2 by FDA under an Emergency Use Authorization (EUA). This EUA will remain in effect (meaning this test can be used) for the duration of the COVID-19 declaration under Section 564(b)(1) of the Act, 21 U.S.C. section 360bbb-3(b)(1), unless the authorization is terminated or revoked.  Performed at [facility]  Lab, [address] [place], [place] [address]   Culture, blood (routine x 2)     Status: None (Preliminary result)   Collection Time: 03/27/21  3:08 PM   Specimen: BLOOD  Result Value Ref Range Status   Specimen Description   Final    BLOOD RIGHT ANTECUBITAL Performed at [facility], [address], [place], [place] [address]    Special Requests   Final    BOTTLES DRAWN AEROBIC AND ANAEROBIC Blood Culture adequate volume Performed at [facility], [address] [place], [place] [address]    Culture  Setup Time   Final    GRAM NEGATIVE RODS ANAEROBIC BOTTLE ONLY CRITICAL VALUE NOTED.  VALUE IS CONSISTENT WITH PREVIOUSLY REPORTED AND CALLED VALUE. Performed at [facility], [place] [address] [place], [place] [address]    Culture GRAM NEGATIVE RODS  Final   Report Status PENDING  Incomplete  Culture, blood (routine x 2)     Status: Abnormal (Preliminary result)   Collection Time: 03/27/21  3:08 PM   Specimen: BLOOD  Result Value Ref Range Status   Specimen Description   Final    BLOOD BLOOD RIGHT FOREARM Performed at [facility], [address] [place], [place] [address]    Special Requests   Final    BOTTLES DRAWN AEROBIC AND ANAEROBIC Blood Culture results may not be optimal due to an inadequate volume of blood received in culture bottles Performed at [facility], [address], [place], [place] [address]    Culture  Setup Time   Final    GRAM NEGATIVE RODS IN BOTH AEROBIC AND ANAEROBIC BOTTLES CRITICAL RESULT CALLED TO, READ BACK BY AND VERIFIED WITH: NATHAN BELUE @ 0348 03/28/2021 LFD    Culture (A)  Final    ESCHERICHIA COLI SUSCEPTIBILITIES TO FOLLOW Performed at [facility], [place] [address], [place], [place] [address]    Report Status PENDING  Incomplete  Urine Culture     Status: Abnormal (Preliminary result)   Collection Time: 03/27/21  3:08 PM   Specimen: Urine, Random  Result Value Ref Range Status   Specimen Description   Final    URINE, RANDOM Performed at [facility], [address] [place], [place] [address]    Special Requests   Final    NONE Performed at [facility], [address], [place], [place] [address]    Culture (A)  Final    >=100,000 COLONIES/mL Lonell Grandchild NEGATIVE RODS SUSCEPTIBILITIES TO FOLLOW Performed at [facility], [place] [address], [place], [place] [address]    Report Status PENDING  Incomplete  Chlamydia/NGC rt PCR ([place] only)     Status: None   Collection Time: 03/27/21  3:08 PM   Specimen: Cervical/Vaginal swab  Result Value Ref Range Status   Specimen source GC/Chlam URINE, RANDOM  Final   Chlamydia Tr NOT DETECTED NOT DETECTED Final   N gonorrhoeae NOT DETECTED NOT DETECTED Final    Comment: (NOTE) This CT/NG assay has not been evaluated in patients with a history of  hysterectomy. Performed at [facility], [address], [place], [place] [address]   Blood Culture ID Panel (Reflexed)     Status: Abnormal   Collection Time: 03/27/21  3:08 PM  Result Value Ref Range Status   Enterococcus faecalis NOT DETECTED NOT DETECTED Final   Enterococcus Faecium NOT DETECTED NOT DETECTED Final   Listeria monocytogenes NOT DETECTED NOT DETECTED Final  Staphylococcus species NOT DETECTED NOT DETECTED Final   Staphylococcus aureus (BCID) NOT  DETECTED NOT DETECTED Final   Staphylococcus epidermidis NOT DETECTED NOT DETECTED Final   Staphylococcus lugdunensis NOT DETECTED NOT DETECTED Final   Streptococcus species NOT DETECTED NOT DETECTED Final   Streptococcus agalactiae NOT DETECTED NOT DETECTED Final   Streptococcus pneumoniae NOT DETECTED NOT DETECTED Final   Streptococcus pyogenes NOT DETECTED NOT DETECTED Final   A.calcoaceticus-baumannii NOT DETECTED NOT DETECTED Final   Bacteroides fragilis NOT DETECTED NOT DETECTED Final   Enterobacterales DETECTED (A) NOT DETECTED Final    Comment: Enterobacterales represent a large order of gram negative bacteria, not a single organism. CRITICAL RESULT CALLED TO, READ BACK BY AND VERIFIED WITH: NATHAN BELUE @ 5102 03/28/21 LFD     Enterobacter cloacae complex NOT DETECTED NOT DETECTED Final   Escherichia coli DETECTED (A) NOT DETECTED Final    Comment: CRITICAL RESULT CALLED TO, READ BACK BY AND VERIFIED WITH: NATHAN BELUE @ 5852 03/28/21 LFD    Klebsiella aerogenes NOT DETECTED NOT DETECTED Final   Klebsiella oxytoca NOT DETECTED NOT DETECTED Final   Klebsiella pneumoniae NOT DETECTED NOT DETECTED Final   Proteus species NOT DETECTED NOT DETECTED Final   Salmonella species NOT DETECTED NOT DETECTED Final   Serratia marcescens NOT DETECTED NOT DETECTED Final   Haemophilus influenzae NOT DETECTED NOT DETECTED Final   Neisseria meningitidis NOT DETECTED NOT DETECTED Final   Pseudomonas aeruginosa NOT DETECTED NOT DETECTED Final   Stenotrophomonas maltophilia NOT DETECTED NOT DETECTED Final   Candida albicans NOT DETECTED NOT DETECTED Final   Candida auris NOT DETECTED NOT DETECTED Final   Candida glabrata NOT DETECTED NOT DETECTED Final   Candida krusei NOT DETECTED NOT DETECTED Final   Candida parapsilosis NOT DETECTED NOT DETECTED Final   Candida tropicalis NOT DETECTED NOT DETECTED Final   Cryptococcus neoformans/gattii NOT DETECTED NOT DETECTED Final   CTX-M ESBL NOT  DETECTED NOT DETECTED Final   Carbapenem resistance IMP NOT DETECTED NOT DETECTED Final   Carbapenem resistance KPC NOT DETECTED NOT DETECTED Final   Carbapenem resistance NDM NOT DETECTED NOT DETECTED Final   Carbapenem resist OXA 48 LIKE NOT DETECTED NOT DETECTED Final   Carbapenem resistance VIM NOT DETECTED NOT DETECTED Final    Comment: Performed at [facility], [address] [place], [place] [address]      Radiology Studies: No results found.  Scheduled Meds:  ferrous sulfate  325 mg Oral BID WC   Rivaroxaban  15 mg Oral BID WC   Followed by   Derrill Memo ON 04/19/2021] rivaroxaban  20 mg Oral Q supper   Continuous Infusions:  sodium chloride Stopped (03/29/21 0723)   cefTRIAXone (ROCEPHIN)  IV Stopped (03/29/21 0429)   ferumoxytol     promethazine (PHENERGAN) injection (IM or IVPB) Stopped (03/27/21 1913)     LOS: 1 day   Time spent: 35 minutes. More than 50% of the time was spent in counseling/coordination of care  Lorella Nimrod, MD [facility]  If 7PM-7AM, please contact night-coverage [email or web address]  03/29/2021, 5:10 PM   This record has been created using Systems analyst. Errors have been sought and corrected,but may not always be located. Such creation errors do not reflect on the standard of care.

## 2021-03-30 DIAGNOSIS — Z6841 Body Mass Index (BMI) 40.0 and over, adult: Secondary | ICD-10-CM

## 2021-03-30 LAB — CULTURE, BLOOD (ROUTINE X 2): Special Requests: ADEQUATE

## 2021-03-30 LAB — URINE CULTURE: Culture: >=100000 — AB

## 2021-03-30 MED ORDER — RIVAROXABAN 20 MG PO TABS: 20.0000 mg | ORAL_TABLET | Freq: Every day | ORAL | 1 refills | Status: DC

## 2021-03-30 MED ORDER — RIVAROXABAN (XARELTO) VTE STARTER PACK (15 & 20 MG)
ORAL_TABLET | ORAL | 0 refills | Status: DC
Start: 2021-03-30 — End: 2021-06-07

## 2021-03-30 MED ORDER — SULFAMETHOXAZOLE-TRIMETHOPRIM 800-160 MG PO TABS: 1.0000 | ORAL_TABLET | Freq: Two times a day (BID) | ORAL | 0 refills | Status: AC

## 2021-03-30 NOTE — Discharge Summary (Signed)
Physician Discharge Summary  Rachel Clayton:[id number] DOB: Jan 03, 1991 DOA: 03/27/2021  PCP: Patient, No Pcp Per (Inactive)  Admit date: 03/27/2021 Discharge date: 03/30/2021  Admitted From: Home Disposition: Home  Recommendations for Outpatient Follow-up:  Follow up with PCP in 1-2 weeks Follow-up with OB/GYN Please obtain BMP/CBC in one week Please follow up on the following pending results: Hypercoagulable lab results.  Home Health: No Equipment/Devices: None Discharge Condition: Stable CODE STATUS: Full Diet recommendation: Heart Healthy / Carb Modified   Brief/Interim Summary: Rachel Clayton is a 30 y.o. female with medical history significant for history of C-section and successful VBAC, obesity, asthma, history of recurrent UTI, presents the emergency department for chief concerns of persistent nausea, vomiting, fever, chills.  Symptoms started on 8/11, initially presented to ED on 03/26/2021 but left without being seen due to prolonged wait time. Due to persistent nausea and vomiting and unable to keep anything down she came back to ED on 03/27/2021.   She was found to be febrile at 101, tachycardic at 112, mild tachypnea in the 20s, leukocytosis 18.3, hemoglobin of 10.6, UA with large leukocytes.  CT abdomen with concern of bilateral pyelonephritis, Admitted for urosepsis, blood and urine cultures positive for pansensitive E. coli.  She received ceftriaxone while in the hospital and discharged on Bactrim for 10 more days.  She was also found to have bilateral ovarian vein thrombosis and mild edema adjacent to the left ovarian vein and some retroperitoneal inflammation.  There was some diverticulosis without any sign of diverticulitis.  Vascular surgery was also consulted and she received Heparin infusion for 2 days followed by Xarelto.  Hypercoagulable work-up obtained-pending results.  Her PCP should be able to follow-up on the results.  She will need at least 3 to 6 months of  anticoagulation.  Her PCP should decide about the duration.  Ovarian thrombosis can be due to sepsis but little unusual.  She was not on any oral contraceptives.  Most recent pregnancy was in 2018 so she was not postpartum either. She might also need a malignant work-up and PCP can determine the need.[name]  She was also found to have a hypodense lesion arising from upper left kidney which has been increased in size from a prior exam done in 2018.  They were recommending renal protocol MRI for better characterization after the resolution of acute symptoms.  We advised the patient to discussed with so they can order imaging in 2 to 4 weeks.  She was also evaluated by OB/GYN because of menorrhagia and ovarian vein thrombosis.  Anemia panel with iron deficiency.  She did received 1 dose of IV iron with Feraheme and will continue home dose of iron supplement.  She will follow-up with her OB/GYN as an outpatient for further recommendations and treatment.  Patient was found to have stage III obesity with BMI of 42.03, counseling was provided and she will need continuation of counseling for weight loss. Also found to have hirsutism.  A1c was checked and it was 5.7 which makes her prediabetic.  Her PCP should be able to keep an eye and treat as needed.  Patient was advised to have a close follow-up with PCP and OB/GYN.  Discharge Diagnoses:  Active Problems:   Menorrhagia with irregular cycle   Obesity BMI=42.3   Hirsutism   Sepsis (HCC)   Essential hypertension   Thrombosis of ovarian vein   Pyelonephritis   RUQ pain   Discharge Instructions  Discharge Instructions     Diet -  low sodium heart healthy   Complete by: As directed    Discharge instructions   Complete by: As directed    It was pleasure taking care of you. You are being given antibiotics for 10 more days, please take it as directed. You are also being started on a blood thinner called Xarelto, you will take 15 mg twice daily for 3  weeks and then start taking 20 mg daily.  You will need at least 3 to 6 months of anticoagulation.  Your primary care doctor or gynecologist should be determined the duration depending on your pending labs to see if there is any increase susceptibility for clotting. Please follow-up with your primary care doctor and/or gynecologist for further recommendations.   Increase activity slowly   Complete by: As directed       Allergies as of 03/30/2021   No Known Allergies      Medication List     TAKE these medications    ferrous sulfate 325 (65 FE) MG tablet Commonly known as: FerrouSul Take 1 tablet (325 mg total) by mouth 2 (two) times daily with a meal.   ibuprofen 200 MG tablet Commonly known as: ADVIL Take 400-600 mg by mouth every 6 (six) hours as needed for mild pain.   Rivaroxaban Stater Pack (15 mg and 20 mg) Commonly known as: XARELTO STARTER PACK Follow package directions: Take one  tablet by mouth twice a day. On day 22, switch to one  tablet once a day. Take with food.   rivaroxaban 20 MG Tabs tablet Commonly known as: Xarelto Take 1 tablet (20 mg total) by mouth daily with supper.   sulfamethoxazole-trimethoprim 800-160 MG tablet Commonly known as: BACTRIM DS Take 1 tablet by mouth 2 (two) times daily for 10 days.        No Known Allergies  Consultations: Vascular surgery OB/GYN  Procedures/Studies: DG Chest 2 View  Result Date: 03/27/2021 CLINICAL DATA:  30 year old female with fever EXAM: CHEST - 2 VIEW COMPARISON:  01/12/2021 FINDINGS: Cardiomediastinal silhouette unchanged in size and contour. No evidence of central vascular congestion. No interlobular septal thickening. No pneumothorax or pleural effusion. Coarsened interstitial markings, with no confluent airspace disease. No acute displaced fracture. Degenerative changes of the spine. IMPRESSION: No active cardiopulmonary disease. Electronically Signed   By: Gilmer Mor D.O.   On: 03/27/2021  13:09   CT Abdomen Pelvis W Contrast  Result Date: 03/27/2021 CLINICAL DATA:  Abdominal abscess/infection suspected Fever and vomiting. EXAM: CT ABDOMEN AND PELVIS WITH CONTRAST TECHNIQUE: Multidetector CT imaging of the abdomen and pelvis was performed using the standard protocol following bolus administration of intravenous contrast. CONTRAST:  OMNIPAQUE IOHEXOL 350 MG/ML SOLN COMPARISON:  Abdominal ultrasound earlier today. FINDINGS: Lower chest: The lung bases are clear. No acute airspace disease or pleural effusion. Hepatobiliary: Diffusely decreased hepatic density consistent with steatosis. Slightly more focal fatty infiltration adjacent to the falciform ligament. No suspicious liver lesion. Gallbladder physiologically distended, no calcified stone. No biliary dilatation. Pancreas: No ductal dilatation or inflammation. Spleen: Borderline enlarged spanning 13.6 x 12.6 x 5.1 cm (volume = 460 cm^3). Splenule posteriorly but no focal lesion. Adrenals/Urinary Tract: No adrenal nodule. Heterogeneous left renal enhancement with perinephric edema. No visualized renal calculi. 12 x 16 mm hypodense lesion arising from the upper left kidney is nonspecific. This does not have peripheral enhancement or thick walls. There is slight right renal heterogeneity and equivocal perinephric edema. Urinary bladder is minimally distended no definite bladder wall thickening or perivesicular  fat stranding. Stomach/Bowel: Decompressed stomach. No small bowel obstruction or inflammation. Normal appendix. There is scattered colonic diverticulosis. No diverticulitis or acute colonic inflammation. Vascular/Lymphatic: Normal caliber abdominal aorta. The right ovarian vein is expanded and contains intraluminal low densities suspicious for ovarian vein thrombus, for example series 2 images 52 through 62. There is questionable low-density involving the left ovarian vein which is ill-defined, for example series 2, image 45. There  multiple small retroperitoneal lymph nodes that are not enlarged by size criteria. No enlarged lymph nodes in the abdomen or pelvis. Reproductive: Anteverted uterus. The ovaries are symmetric in size. Probable corpus luteal cyst in the right ovary. Other: No free air, free fluid, or intra-abdominal fluid collection. Musculoskeletal: There are no acute or suspicious osseous abnormalities. IMPRESSION: 1. Heterogeneous left renal enhancement with perinephric edema, suspicious for pyelonephritis. There is also mild heterogeneous right renal enhancement which may represent pyelonephritis. 2. Indeterminate 12 x 16 mm hypodense lesion arising from the upper left kidney. This does not have peripheral enhancement or thick wall to suggest abscess. A 9 mm lesion with seen on prior 11/24/2016 abdominal MRI recommend. Recommend follow-up renal protocol MRI for characterization after resolution of acute symptoms. 3. Findings consistent with right and possibly left ovarian vein thrombosis. There is mild edema adjacent to the left ovarian vein. While this may be reactive related to retroperitoneal inflammation, etiology is indeterminate. 4. Hepatic steatosis. Borderline splenomegaly. 5. Colonic diverticulosis without diverticulitis. These results were called by telephone at the time of interpretation on 03/27/2021 at 4:28 pm to provider [facility] , who verbally acknowledged these results. Electronically Signed   By: Narda Rutherford M.D.   On: 03/27/2021 16:29   US Abdomen Limited RUQ (LIVER/GB)  Result Date: 03/27/2021 CLINICAL DATA:  RIGHT upper quadrant pain, nausea and vomiting for 4 days. EXAM: ULTRASOUND ABDOMEN LIMITED RIGHT UPPER QUADRANT COMPARISON:  None. FINDINGS: Gallbladder: No gallstones or wall thickening visualized. No sonographic Murphy sign noted by sonographer. Common bile duct: Diameter: 1 mm Liver: No focal lesion identified. Liver is diffusely echogenic suggesting fatty infiltration. Portal vein is  patent on color Doppler imaging with normal direction of blood flow towards the liver. Other: None. IMPRESSION: 1. No acute findings. Gallbladder appears normal. No bile duct dilatation. 2. Probable fatty infiltration of the liver. Electronically Signed   By: Bary Richard M.D.   On: 03/27/2021 14:11    Subjective: Patient was seen and examined today.  No new complaints.  She wants to go home.  We discussed about using antibiotics for 10 more days and remain on anticoagulation for at least 60-month and follow-up with her providers for further recommendations.  She seems understanding.  Discharge Exam: Vitals:   03/30/21 0811 03/30/21 1100  BP: (!) 113/58 117/62  Pulse: 73 69  Resp: 18 19  Temp: 98.1 F (36.7 C) 98.1 F (36.7 C)  SpO2: 98% 98%   Vitals:   03/29/21 2235 03/30/21 0432 03/30/21 0811 03/30/21 1100  BP: 125/83 132/70 (!) 113/58 117/62  Pulse: 77 90 73 69  Resp: Temp: 98.5 F (36.9 C) 98.6 F (37 C) 98.1 F (36.7 C) 98.1 F (36.7 C)  TempSrc:  Oral  Oral  SpO2: 100% 100% 98% 98%  Weight:      Height:        General: Pt is alert, awake, not in acute distress Cardiovascular: RRR, S1/S2 +, no rubs, no gallops Respiratory: CTA bilaterally, no wheezing, no rhonchi Abdominal: Soft, NT, ND, bowel  sounds + Extremities: no edema, no cyanosis   The results of significant diagnostics from this hospitalization (including imaging, microbiology, ancillary and laboratory) are listed below for reference.    Microbiology: Recent Results (from the past 240 hour(s))  Resp Panel by RT-PCR (Flu A&B, Covid) Nasopharyngeal Swab     Status: None   Collection Time: 03/27/21  1:28 PM   Specimen: Nasopharyngeal Swab; Nasopharyngeal(NP) swabs in vial transport medium  Result Value Ref Range Status   SARS Coronavirus 2 by RT PCR NEGATIVE NEGATIVE Final    Comment: (NOTE) SARS-CoV-2 target nucleic acids are NOT DETECTED.  The SARS-CoV-2 RNA is generally detectable in upper  respiratory specimens during the acute phase of infection. The lowest concentration of SARS-CoV-2 viral copies this assay can detect is 138 copies/mL. A negative result does not preclude SARS-Cov-2 infection and should not be used as the sole basis for treatment or other patient management decisions. A negative result may occur with  improper specimen collection/handling, submission of specimen other than nasopharyngeal swab, presence of viral mutation(s) within the areas targeted by this assay, and inadequate number of viral copies(<138 copies/mL). A negative result must be combined with clinical observations, patient history, and epidemiological information. The expected result is Negative.  Fact Sheet for Patients:  BloggerCourse.com  Fact Sheet for Healthcare Providers:  SeriousBroker.it  This test is no t yet approved or cleared by the [place] FDA and  has been authorized for detection and/or diagnosis of SARS-CoV-2 by FDA under an Emergency Use Authorization (EUA). This EUA will remain  in effect (meaning this test can be used) for the duration of the COVID-19 declaration under Section 564(b)(1) of the Act, 21 U.S.C.section 360bbb-3(b)(1), unless the authorization is terminated  or revoked sooner.       Influenza A by PCR NEGATIVE NEGATIVE Final   Influenza B by PCR NEGATIVE NEGATIVE Final    Comment: (NOTE) The Xpert Xpress SARS-CoV-2/FLU/RSV plus assay is intended as an aid in the diagnosis of influenza from Nasopharyngeal swab specimens and should not be used as a sole basis for treatment. Nasal washings and aspirates are unacceptable for Xpert Xpress SARS-CoV-2/FLU/RSV testing.  Fact Sheet for Patients: BloggerCourse.com  Fact Sheet for Healthcare Providers: SeriousBroker.it  This test is not yet approved or cleared by the [place] FDA and has been  authorized for detection and/or diagnosis of SARS-CoV-2 by FDA under an Emergency Use Authorization (EUA). This EUA will remain in effect (meaning this test can be used) for the duration of the COVID-19 declaration under Section 564(b)(1) of the Act, 21 U.S.C. section 360bbb-3(b)(1), unless the authorization is terminated or revoked.  Performed at [facility] [place] [facility], [address] [place], [place] [address]   Culture, blood (routine x 2)     Status: Abnormal   Collection Time: 03/27/21  3:08 PM   Specimen: BLOOD  Result Value Ref Range Status   Specimen Description   Final    BLOOD RIGHT ANTECUBITAL Performed at [facility], [address] [place], [place] [address]    Special Requests   Final    BOTTLES DRAWN AEROBIC AND ANAEROBIC Blood Culture adequate volume Performed at [facility], [address] [place], [place] [address]    Culture  Setup Time   Final    GRAM NEGATIVE RODS ANAEROBIC BOTTLE ONLY CRITICAL VALUE NOTED.  VALUE IS CONSISTENT WITH PREVIOUSLY REPORTED AND CALLED VALUE.    Culture (A)  Final    ESCHERICHIA COLI SUSCEPTIBILITIES PERFORMED ON PREVIOUS  CULTURE WITHIN THE LAST 5 DAYS. Performed at [facility], [address] [place], [place] [address]    Report Status 03/30/2021 FINAL  Final  Culture, blood (routine x 2)     Status: Abnormal   Collection Time: 03/27/21  3:08 PM   Specimen: BLOOD  Result Value Ref Range Status   Specimen Description   Final    BLOOD BLOOD RIGHT FOREARM Performed at [facility], [address] [place], [place] [address]    Special Requests   Final    BOTTLES DRAWN AEROBIC AND ANAEROBIC Blood Culture results may not be optimal due to an inadequate volume of blood received in culture bottles Performed at [facility], [address] [place], [place] [address]    Culture  Setup Time   Final    GRAM NEGATIVE RODS IN BOTH AEROBIC AND ANAEROBIC BOTTLES CRITICAL RESULT  CALLED TO, READ BACK BY AND VERIFIED WITHDawayne Cirri: NATHAN BELUE @ [phone] 03/28/2021 LFD Performed at [facility], [address] [place], [place] [address]    Culture ESCHERICHIA COLI (A)  Final   Report Status 03/30/2021 FINAL  Final   Organism ID, Bacteria ESCHERICHIA COLI  Final      Susceptibility   Escherichia coli - MIC*    AMPICILLIN <=2 SENSITIVE Sensitive     CEFAZOLIN <=4 SENSITIVE Sensitive     CEFEPIME <=0.12 SENSITIVE Sensitive     CEFTAZIDIME <=1 SENSITIVE Sensitive     CEFTRIAXONE <=0.25 SENSITIVE Sensitive     CIPROFLOXACIN <=0.25 SENSITIVE Sensitive     GENTAMICIN <=1 SENSITIVE Sensitive     IMIPENEM <=0.25 SENSITIVE Sensitive     TRIMETH/SULFA <=20 SENSITIVE Sensitive     AMPICILLIN/SULBACTAM <=2 SENSITIVE Sensitive     PIP/TAZO <=4 SENSITIVE Sensitive     * ESCHERICHIA COLI  Urine Culture     Status: Abnormal   Collection Time: 03/27/21  3:08 PM   Specimen: Urine, Random  Result Value Ref Range Status   Specimen Description   Final    URINE, RANDOM Performed at [facility], [address] [place], [place] [address]    Special Requests   Final    NONE Performed at [facility], [address] [place], [place] [address]    Culture >=100,000 COLONIES/mL ESCHERICHIA COLI (A)  Final   Report Status 03/30/2021 FINAL  Final   Organism ID, Bacteria ESCHERICHIA COLI (A)  Final      Susceptibility   Escherichia coli - MIC*    AMPICILLIN <=2 SENSITIVE Sensitive     CEFAZOLIN <=4 SENSITIVE Sensitive     CEFEPIME <=0.12 SENSITIVE Sensitive     CEFTRIAXONE <=0.25 SENSITIVE Sensitive     CIPROFLOXACIN <=0.25 SENSITIVE Sensitive     GENTAMICIN <=1 SENSITIVE Sensitive     IMIPENEM <=0.25 SENSITIVE Sensitive     NITROFURANTOIN <=16 SENSITIVE Sensitive     TRIMETH/SULFA <=20 SENSITIVE Sensitive     AMPICILLIN/SULBACTAM <=2 SENSITIVE Sensitive     PIP/TAZO <=4 SENSITIVE Sensitive     * >=100,000 COLONIES/mL ESCHERICHIA COLI  Chlamydia/NGC rt PCR  ([facility] only)     Status: None   Collection Time: 03/27/21  3:08 PM   Specimen: Cervical/Vaginal swab  Result Value Ref Range Status   Specimen source GC/Chlam URINE, RANDOM  Final   Chlamydia Tr NOT DETECTED NOT DETECTED Final   N gonorrhoeae NOT DETECTED NOT DETECTED Final    Comment: (NOTE) This CT/NG assay has not been evaluated in patients  with a history of  hysterectomy. Performed at [facility], [address] [place], [place] [address]   Blood Culture ID Panel (Reflexed)     Status: Abnormal   Collection Time: 03/27/21  3:08 PM  Result Value Ref Range Status   Enterococcus faecalis NOT DETECTED NOT DETECTED Final   Enterococcus Faecium NOT DETECTED NOT DETECTED Final   Listeria monocytogenes NOT DETECTED NOT DETECTED Final   Staphylococcus species NOT DETECTED NOT DETECTED Final   Staphylococcus aureus (BCID) NOT DETECTED NOT DETECTED Final   Staphylococcus epidermidis NOT DETECTED NOT DETECTED Final   Staphylococcus lugdunensis NOT DETECTED NOT DETECTED Final   Streptococcus species NOT DETECTED NOT DETECTED Final   Streptococcus agalactiae NOT DETECTED NOT DETECTED Final   Streptococcus pneumoniae NOT DETECTED NOT DETECTED Final   Streptococcus pyogenes NOT DETECTED NOT DETECTED Final   A.calcoaceticus-baumannii NOT DETECTED NOT DETECTED Final   Bacteroides fragilis NOT DETECTED NOT DETECTED Final   Enterobacterales DETECTED (A) NOT DETECTED Final    Comment: Enterobacterales represent a large order of gram negative bacteria, not a single organism. CRITICAL RESULT CALLED TO, READ BACK BY AND VERIFIED WITH: NATHAN BELUE @ 0348 03/28/21 LFD     Enterobacter cloacae complex NOT DETECTED NOT DETECTED Final   Escherichia coli DETECTED (A) NOT DETECTED Final    Comment: CRITICAL RESULT CALLED TO, READ BACK BY AND VERIFIED WITH: NATHAN BELUE @ 0348 03/28/21 LFD    Klebsiella aerogenes NOT DETECTED NOT DETECTED Final   Klebsiella oxytoca NOT DETECTED NOT DETECTED  Final   Klebsiella pneumoniae NOT DETECTED NOT DETECTED Final   Proteus species NOT DETECTED NOT DETECTED Final   Salmonella species NOT DETECTED NOT DETECTED Final   Serratia marcescens NOT DETECTED NOT DETECTED Final   Haemophilus influenzae NOT DETECTED NOT DETECTED Final   Neisseria meningitidis NOT DETECTED NOT DETECTED Final   Pseudomonas aeruginosa NOT DETECTED NOT DETECTED Final   Stenotrophomonas maltophilia NOT DETECTED NOT DETECTED Final   Candida albicans NOT DETECTED NOT DETECTED Final   Candida auris NOT DETECTED NOT DETECTED Final   Candida glabrata NOT DETECTED NOT DETECTED Final   Candida krusei NOT DETECTED NOT DETECTED Final   Candida parapsilosis NOT DETECTED NOT DETECTED Final   Candida tropicalis NOT DETECTED NOT DETECTED Final   Cryptococcus neoformans/gattii NOT DETECTED NOT DETECTED Final   CTX-M ESBL NOT DETECTED NOT DETECTED Final   Carbapenem resistance IMP NOT DETECTED NOT DETECTED Final   Carbapenem resistance KPC NOT DETECTED NOT DETECTED Final   Carbapenem resistance NDM NOT DETECTED NOT DETECTED Final   Carbapenem resist OXA 48 LIKE NOT DETECTED NOT DETECTED Final   Carbapenem resistance VIM NOT DETECTED NOT DETECTED Final    Comment: Performed at [facility], [address] [place], [place] [address]     Labs: BNP (last 3 results) No results for input(s): BNP in the last 8760 hours. Basic Metabolic Panel: Recent Labs  Lab 03/26/21 1934 03/27/21 1328 03/27/21 2239 03/28/21 0543  NA 131* 134*  --  138  K 3.4* 3.1*  --  3.6  CL 100 103  --  105  CO2 21* 22  --  24  GLUCOSE 135* 130*  --  121*  BUN 8 10  --  9  CREATININE 0.65 0.89  --  0.79  CALCIUM 8.9 8.7*  --  8.3*  MG  --   --  1.7  --    Liver Function Tests: Recent Labs  Lab 03/26/21 1934 03/27/21 1328  AST 19 20  ALT 18 21  ALKPHOS 57 59  BILITOT 1.3* 1.6*  PROT 8.0 7.9  ALBUMIN 4.0 3.6   Recent Labs  Lab 03/26/21 1934 03/27/21 1328  LIPASE 23 20   No  results for input(s): AMMONIA in the last 168 hours. CBC: Recent Labs  Lab 03/26/21 1934 03/27/21 1328 03/28/21 0543 03/29/21 0554  WBC 16.2* 18.3* 13.6* 9.7  NEUTROABS  --  16.8*  --   --   HGB 11.3* 10.6* 9.2* 8.3*  HCT 36.4 33.4* 28.6* 26.4*  MCV 67.8* 68.7* 67.1* 68.2*  PLT 232 219 175 185   Cardiac Enzymes: No results for input(s): CKTOTAL, CKMB, CKMBINDEX, TROPONINI in the last 168 hours. BNP: Invalid input(s): POCBNP CBG: No results for input(s): GLUCAP in the last 168 hours. D-Dimer No results for input(s): DDIMER in the last 72 hours. Hgb A1c Recent Labs    03/28/21 1436  HGBA1C 5.7*   Lipid Profile No results for input(s): CHOL, HDL, LDLCALC, TRIG, CHOLHDL, LDLDIRECT in the last 72 hours. Thyroid function studies Recent Labs    03/27/21 2239  TSH 1.554   Anemia work up Recent Labs    03/29/21 0554 03/29/21 1133  VITAMINB12  --  909  FOLATE  --  6.9  FERRITIN  --  45  TIBC  --  309  IRON  --  27*  RETICCTPCT 0.7  --    Urinalysis    Component Value Date/Time   COLORURINE YELLOW (A) 03/27/2021 1508   APPEARANCEUR CLOUDY (A) 03/27/2021 1508   APPEARANCEUR Clear 06/26/2014 1204   LABSPEC 1.003 (L) 03/27/2021 1508   LABSPEC 1.014 06/26/2014 1204   PHURINE 7.0 03/27/2021 1508   GLUCOSEU NEGATIVE 03/27/2021 1508   GLUCOSEU Negative 06/26/2014 1204   HGBUR MODERATE (A) 03/27/2021 1508   BILIRUBINUR NEGATIVE 03/27/2021 1508   BILIRUBINUR Negative 06/26/2014 1204   KETONESUR 5 (A) 03/27/2021 1508   PROTEINUR 30 (A) 03/27/2021 1508   NITRITE NEGATIVE 03/27/2021 1508   LEUKOCYTESUR LARGE (A) 03/27/2021 1508   LEUKOCYTESUR Negative 06/26/2014 1204   Sepsis Labs Invalid input(s): PROCALCITONIN,  WBC,  LACTICIDVEN Microbiology Recent Results (from the past 240 hour(s))  Resp Panel by RT-PCR (Flu A&B, Covid) Nasopharyngeal Swab     Status: None   Collection Time: 03/27/21  1:28 PM   Specimen: Nasopharyngeal Swab; Nasopharyngeal(NP) swabs in vial  transport medium  Result Value Ref Range Status   SARS Coronavirus 2 by RT PCR NEGATIVE NEGATIVE Final    Comment: (NOTE) SARS-CoV-2 target nucleic acids are NOT DETECTED.  The SARS-CoV-2 RNA is generally detectable in upper respiratory specimens during the acute phase of infection. The lowest concentration of SARS-CoV-2 viral copies this assay can detect is 138 copies/mL. A negative result does not preclude SARS-Cov-2 infection and should not be used as the sole basis for treatment or other patient management decisions. A negative result may occur with  improper specimen collection/handling, submission of specimen other than nasopharyngeal swab, presence of viral mutation(s) within the areas targeted by this assay, and inadequate number of viral copies(<138 copies/mL). A negative result must be combined with clinical observations, patient history, and epidemiological information. The expected result is Negative.  Fact Sheet for Patients:  BloggerCourse.com  Fact Sheet for Healthcare Providers:  SeriousBroker.it  This test is no t yet approved or cleared by the [place] FDA and  has been authorized for detection and/or diagnosis of SARS-CoV-2 by FDA under an Emergency Use Authorization (EUA). This EUA will remain  in effect (meaning this test can be used) for the duration of the COVID-19 declaration under Section 564(b)(1) of the Act, 21 U.S.C.section 360bbb-3(b)(1), unless the authorization is terminated  or revoked sooner.       Influenza A by PCR NEGATIVE NEGATIVE Final   Influenza B by PCR NEGATIVE NEGATIVE Final    Comment: (NOTE) The Xpert Xpress SARS-CoV-2/FLU/RSV plus assay is intended as an aid in the diagnosis of influenza from Nasopharyngeal swab specimens and should not be used as a sole basis for treatment. Nasal washings and aspirates are unacceptable for Xpert Xpress SARS-CoV-2/FLU/RSV testing.  Fact  Sheet for Patients: BloggerCourse.com  Fact Sheet for Healthcare Providers: SeriousBroker.it  This test is not yet approved or cleared by the [place] FDA and has been authorized for detection and/or diagnosis of SARS-CoV-2 by FDA under an Emergency Use Authorization (EUA). This EUA will remain in effect (meaning this test can be used) for the duration of the COVID-19 declaration under Section 564(b)(1) of the Act, 21 U.S.C. section 360bbb-3(b)(1), unless the authorization is terminated or revoked.  Performed at [facility], [address] [place], [place] [address]   Culture, blood (routine x 2)     Status: Abnormal   Collection Time: 03/27/21  3:08 PM   Specimen: BLOOD  Result Value Ref Range Status   Specimen Description   Final    BLOOD RIGHT ANTECUBITAL Performed at [facility], [address] [place], [place] [address]    Special Requests   Final    BOTTLES DRAWN AEROBIC AND ANAEROBIC Blood Culture adequate volume Performed at [facility], [address], [place], [place] [address]    Culture  Setup Time   Final    GRAM NEGATIVE RODS ANAEROBIC BOTTLE ONLY CRITICAL VALUE NOTED.  VALUE IS CONSISTENT WITH PREVIOUSLY REPORTED AND CALLED VALUE.    Culture (A)  Final    ESCHERICHIA COLI SUSCEPTIBILITIES PERFORMED ON PREVIOUS CULTURE WITHIN THE LAST 5 DAYS. Performed at [facility], [address] [place], [place] [address]    Report Status 03/30/2021 FINAL  Final  Culture, blood (routine x 2)     Status: Abnormal   Collection Time: 03/27/21  3:08 PM   Specimen: BLOOD  Result Value Ref Range Status   Specimen Description   Final    BLOOD BLOOD RIGHT FOREARM Performed at [facility], [address] [place], [place] [address]    Special Requests   Final    BOTTLES DRAWN AEROBIC AND ANAEROBIC Blood Culture results may not be optimal due to an inadequate volume of  blood received in culture bottles Performed at [facility], [address], [place], [place] [address]    Culture  Setup Time   Final    GRAM NEGATIVE RODS IN BOTH AEROBIC AND ANAEROBIC BOTTLES CRITICAL RESULT CALLED TO, READ BACK BY AND VERIFIED WITHDawayne Cirri @ 4650 03/28/2021 LFD Performed at [facility], [address], [place], [place] [address]    Culture ESCHERICHIA COLI (A)  Final   Report Status 03/30/2021 FINAL  Final   Organism ID, Bacteria ESCHERICHIA COLI  Final      Susceptibility   Escherichia coli - MIC*    AMPICILLIN <=2 SENSITIVE Sensitive     CEFAZOLIN <=4 SENSITIVE Sensitive     CEFEPIME <=0.12 SENSITIVE Sensitive     CEFTAZIDIME <=1 SENSITIVE Sensitive     CEFTRIAXONE <=0.25 SENSITIVE Sensitive     CIPROFLOXACIN <=0.25 SENSITIVE Sensitive  GENTAMICIN <=1 SENSITIVE Sensitive     IMIPENEM <=0.25 SENSITIVE Sensitive     TRIMETH/SULFA <=20 SENSITIVE Sensitive     AMPICILLIN/SULBACTAM <=2 SENSITIVE Sensitive     PIP/TAZO <=4 SENSITIVE Sensitive     * ESCHERICHIA COLI  Urine Culture     Status: Abnormal   Collection Time: 03/27/21  3:08 PM   Specimen: Urine, Random  Result Value Ref Range Status   Specimen Description   Final    URINE, RANDOM Performed at [facility], [address] [place], [place] [address]    Special Requests   Final    NONE Performed at [facility], [address] [place], [place] [address]    Culture >=100,000 COLONIES/mL ESCHERICHIA COLI (A)  Final   Report Status 03/30/2021 FINAL  Final   Organism ID, Bacteria ESCHERICHIA COLI (A)  Final      Susceptibility   Escherichia coli - MIC*    AMPICILLIN <=2 SENSITIVE Sensitive     CEFAZOLIN <=4 SENSITIVE Sensitive     CEFEPIME <=0.12 SENSITIVE Sensitive     CEFTRIAXONE <=0.25 SENSITIVE Sensitive     CIPROFLOXACIN <=0.25 SENSITIVE Sensitive     GENTAMICIN <=1 SENSITIVE Sensitive     IMIPENEM <=0.25 SENSITIVE Sensitive     NITROFURANTOIN  <=16 SENSITIVE Sensitive     TRIMETH/SULFA <=20 SENSITIVE Sensitive     AMPICILLIN/SULBACTAM <=2 SENSITIVE Sensitive     PIP/TAZO <=4 SENSITIVE Sensitive     * >=100,000 COLONIES/mL ESCHERICHIA COLI  Chlamydia/NGC rt PCR ([facility] only)     Status: None   Collection Time: 03/27/21  3:08 PM   Specimen: Cervical/Vaginal swab  Result Value Ref Range Status   Specimen source GC/Chlam URINE, RANDOM  Final   Chlamydia Tr NOT DETECTED NOT DETECTED Final   N gonorrhoeae NOT DETECTED NOT DETECTED Final    Comment: (NOTE) This CT/NG assay has not been evaluated in patients with a history of  hysterectomy. Performed at [facility], [address] [place], [place] [address]   Blood Culture ID Panel (Reflexed)     Status: Abnormal   Collection Time: 03/27/21  3:08 PM  Result Value Ref Range Status   Enterococcus faecalis NOT DETECTED NOT DETECTED Final   Enterococcus Faecium NOT DETECTED NOT DETECTED Final   Listeria monocytogenes NOT DETECTED NOT DETECTED Final   Staphylococcus species NOT DETECTED NOT DETECTED Final   Staphylococcus aureus (BCID) NOT DETECTED NOT DETECTED Final   Staphylococcus epidermidis NOT DETECTED NOT DETECTED Final   Staphylococcus lugdunensis NOT DETECTED NOT DETECTED Final   Streptococcus species NOT DETECTED NOT DETECTED Final   Streptococcus agalactiae NOT DETECTED NOT DETECTED Final   Streptococcus pneumoniae NOT DETECTED NOT DETECTED Final   Streptococcus pyogenes NOT DETECTED NOT DETECTED Final   A.calcoaceticus-baumannii NOT DETECTED NOT DETECTED Final   Bacteroides fragilis NOT DETECTED NOT DETECTED Final   Enterobacterales DETECTED (A) NOT DETECTED Final    Comment: Enterobacterales represent a large order of gram negative bacteria, not a single organism. CRITICAL RESULT CALLED TO, READ BACK BY AND VERIFIED WITH: NATHAN BELUE @ 0348 03/28/21 LFD     Enterobacter cloacae complex NOT DETECTED NOT DETECTED Final   Escherichia coli DETECTED (A) NOT  DETECTED Final    Comment: CRITICAL RESULT CALLED TO, READ BACK BY AND VERIFIED WITH: NATHAN BELUE @ 0348 03/28/21 LFD    Klebsiella aerogenes NOT DETECTED NOT DETECTED Final   Klebsiella oxytoca NOT DETECTED NOT DETECTED Final   Klebsiella pneumoniae NOT DETECTED NOT  DETECTED Final   Proteus species NOT DETECTED NOT DETECTED Final   Salmonella species NOT DETECTED NOT DETECTED Final   Serratia marcescens NOT DETECTED NOT DETECTED Final   Haemophilus influenzae NOT DETECTED NOT DETECTED Final   Neisseria meningitidis NOT DETECTED NOT DETECTED Final   Pseudomonas aeruginosa NOT DETECTED NOT DETECTED Final   Stenotrophomonas maltophilia NOT DETECTED NOT DETECTED Final   Candida albicans NOT DETECTED NOT DETECTED Final   Candida auris NOT DETECTED NOT DETECTED Final   Candida glabrata NOT DETECTED NOT DETECTED Final   Candida krusei NOT DETECTED NOT DETECTED Final   Candida parapsilosis NOT DETECTED NOT DETECTED Final   Candida tropicalis NOT DETECTED NOT DETECTED Final   Cryptococcus neoformans/gattii NOT DETECTED NOT DETECTED Final   CTX-M ESBL NOT DETECTED NOT DETECTED Final   Carbapenem resistance IMP NOT DETECTED NOT DETECTED Final   Carbapenem resistance KPC NOT DETECTED NOT DETECTED Final   Carbapenem resistance NDM NOT DETECTED NOT DETECTED Final   Carbapenem resist OXA 48 LIKE NOT DETECTED NOT DETECTED Final   Carbapenem resistance VIM NOT DETECTED NOT DETECTED Final    Comment: Performed at [facility], [address] [place], [place] [address]    Time coordinating discharge: Over 30 minutes  SIGNED:  Arnetha Courser, MD  [facility] 03/30/2021, 2:56 PM  If 7PM-7AM, please contact night-coverage [email or web address]  This record has been created using Conservation officer, historic buildings. Errors have been sought and corrected,but may not always be located. Such creation errors do not reflect on the standard of care.

## 2021-04-02 LAB — ANTITHROMBIN PANEL
AT III AG PPP IMM-ACNC: 76 % (ref 72–124)
Antithrombin Activity: 71 % — ABNORMAL LOW (ref 75–135)

## 2021-04-05 LAB — FACTOR 5 LEIDEN

## 2021-04-05 LAB — PROTHROMBIN GENE MUTATION

## 2021-04-07 LAB — ANTIPHOSPHOLIPID SYNDROME EVAL, BLD
Anticardiolipin IgA: <9 APL U/mL (ref 0–11)
Anticardiolipin IgG: <9 GPL U/mL (ref 0–14)
Anticardiolipin IgM: <9 MPL U/mL (ref 0–12)
DRVVT: 47.6 s — ABNORMAL HIGH (ref 0.0–47.0)
PTT Lupus Anticoagulant: 50 s (ref 0.0–51.9)
Phosphatydalserine, IgA: <1 APS Units (ref 0–19)
Phosphatydalserine, IgG: 9 Units (ref 0–30)
Phosphatydalserine, IgM: <10 Units (ref 0–30)

## 2021-04-07 LAB — DRVVT CONFIRM: dRVVT Confirm: 1.1 ratio (ref 0.8–1.2)

## 2021-04-07 LAB — DRVVT MIX: dRVVT Mix: 44.3 s — ABNORMAL HIGH (ref 0.0–40.4)

## 2021-04-15 DIAGNOSIS — Z419 Encounter for procedure for purposes other than remedying health state, unspecified: Secondary | ICD-10-CM | POA: Diagnosis not present

## 2021-04-21 ENCOUNTER — Ambulatory Visit (INDEPENDENT_AMBULATORY_CARE_PROVIDER_SITE_OTHER): Payer: Medicaid Other | Admitting: Obstetrics and Gynecology

## 2021-04-21 ENCOUNTER — Other Ambulatory Visit: Payer: Self-pay

## 2021-04-21 ENCOUNTER — Encounter: Payer: Self-pay | Admitting: Obstetrics and Gynecology

## 2021-04-21 VITALS — BP 121/70 | Ht 64.0 in | Wt 243.0 lb

## 2021-04-21 DIAGNOSIS — I82891 Chronic embolism and thrombosis of other specified veins: Secondary | ICD-10-CM

## 2021-04-21 NOTE — Progress Notes (Signed)
Obstetrics & Gynecology Office Visit   Chief Complaint:  Chief Complaint  Patient presents with   Follow-up    Seen at ED - pain Rt ovary comes and goes. RM 3    History of Present Illness: 30 y.o. [id number] presenting for follow up from recent hospitalization for pyelonephritis 03/26/2021 to 03/31/2019.  The patient was also noted to have a ovarian vein thrombosis.  Xarelto by vascular surgery.  She reports feeling well since discharge, improvement in pelvic pain.  She has had one menstrual cycle since discharge and despite Xarelto reports that his was not particular prolonged or heavy.  She is not currently sexually active.       Review of Systems: Review of Systems  Constitutional: Negative.   Gastrointestinal: Negative.   Genitourinary: Negative.   Endo/Heme/Allergies:  Does not bruise/bleed easily.    Past Medical History:  Past Medical History:  Diagnosis Date   Asthma    BMI 40.0-44.9, adult (HCC)    History of blood transfusion 2019   Recurrent UTI     Past Surgical History:  Past Surgical History:  Procedure Laterality Date   CESAREAN SECTION     HAND SURGERY      Gynecologic History: Patient's last menstrual period was 03/30/2021.  Obstetric History: [id number]  Family History:  Family History  Problem Relation Age of Onset   Hypertension Mother     Social History:  Social History   Socioeconomic History   Marital status: Single    Spouse name: Not on file   Number of children: 4   Years of education: 8   Highest education level: 8th grade  Occupational History   Not on file  Tobacco Use   Smoking status: Never   Smokeless tobacco: Never  Vaping Use   Vaping Use: Never used  Substance and Sexual Activity   Alcohol use: Yes    Alcohol/week: 2.0 standard drinks    Types: 2 Shots of liquor per week    Comment: 3-4x/mo   Drug use: Yes    Types: Marijuana    Comment: last use 12/2020   Sexual activity: Yes    Partners: Male    Birth  control/protection: Condom  Other Topics Concern   Not on file  Social History Narrative   Not on file   Social Determinants of Health   Financial Resource Strain: Not on file  Food Insecurity: Not on file  Transportation Needs: Not on file  Physical Activity: Not on file  Stress: Not on file  Social Connections: Not on file  Intimate Partner Violence: Not on file    Allergies:  No Known Allergies  Medications: Prior to Admission medications   Medication Sig Start Date End Date Taking? Authorizing Provider  ferrous sulfate (FERROUSUL) 325 (65 FE) MG tablet Take 1 tablet (325 mg total) by mouth 2 (two) times daily with a meal. 02/11/21  Yes Federico Flake, MD  rivaroxaban (XARELTO) 20 MG TABS tablet Take 1 tablet (20 mg total) by mouth daily with supper. 03/30/21  Yes Arnetha Courser, MD  ibuprofen (ADVIL) 200 MG tablet Take 400-600 mg by mouth every 6 (six) hours as needed for mild pain. Patient not taking: Reported on 04/21/2021    [provider]  RIVAROXABAN Carlena Hurl) VTE STARTER PACK (15 & 20 MG) Follow package directions: Take one 15mg  tablet by mouth twice a day. On day 22, switch to one 20mg  tablet once a day. Take with food. Patient not taking: Reported  on 04/21/2021 03/30/21   Arnetha Courser, MD    Physical Exam Vitals:  Vitals:   04/21/21 0947  BP: 121/70   Patient's last menstrual period was 03/30/2021.  General: NAD HEENT: normocephalic, anicteric Pulmonary: No increased work of breathing Neurologic: Grossly intact Psychiatric: mood appropriate, affect full   Assessment: 30 y.o. G2E3662 follow up ovarian vein thrombosis  Plan: Problem List Items Addressed This Visit   None Visit Diagnoses     Thrombosis of ovarian vein, chronic    -  Primary   Relevant Orders   Ambulatory referral to Hematology / Oncology         1) Pelvic septic thrombophlebitis/ovarian vein thrombosis  - Hematology referral input on length of anticoagulation and any  other hypercoaguable work up.  Presentation in setting of pyelonephritis unusual as this is most commonly associated with postpartum period, malignancy, or compressive effect of fibroid uterus  2) Contraception - declines not currently sexually active  3) Health care maintenance - pap up to date  4) Return in about 1 year (around 04/21/2022) for annual.    Vena Austria, MD, [facility], [facility] 04/21/2021, 10:11 AM

## 2021-05-04 ENCOUNTER — Inpatient Hospital Stay: Payer: Medicaid Other

## 2021-05-04 ENCOUNTER — Inpatient Hospital Stay: Payer: Medicaid Other | Attending: Oncology | Admitting: Oncology

## 2021-05-04 ENCOUNTER — Encounter: Payer: Self-pay | Admitting: Oncology

## 2021-05-04 VITALS — BP 127/85 | HR 78 | Temp 97.8°F | Resp 20 | Wt 242.8 lb

## 2021-05-04 DIAGNOSIS — Z7901 Long term (current) use of anticoagulants: Secondary | ICD-10-CM | POA: Diagnosis not present

## 2021-05-04 DIAGNOSIS — I82890 Acute embolism and thrombosis of other specified veins: Secondary | ICD-10-CM | POA: Diagnosis not present

## 2021-05-04 DIAGNOSIS — D509 Iron deficiency anemia, unspecified: Secondary | ICD-10-CM | POA: Diagnosis not present

## 2021-05-04 DIAGNOSIS — D508 Other iron deficiency anemias: Secondary | ICD-10-CM

## 2021-05-04 NOTE — Progress Notes (Signed)
Hematology/Oncology Consult note [facility] [phone] [phone]   Patient Care Team: Patient, No Pcp Per (Inactive) as PCP - General (General Practice)  REFERRING PROVIDER: Vena Austria, MD  CHIEF COMPLAINTS/REASON FOR VISIT:  Evaluation of ovarian vein thrombosis  HISTORY OF PRESENTING ILLNESS:   Rachel Clayton is a  30 y.o.  female with PMH listed below was seen in consultation at the request of  Vena Austria, MD  for evaluation of ovarian vein thrombosis  03/27/2021 -03/30/2021 hospitalization due to urosepsis, urine culture is positive for pansensitive E coli.  CT abdomen with concern of bilateral pyelonephritis, she was treated with antibiotics.  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  She was referred to hematology for evaluation.   She is currently on Xarelto 20mg  daily. Today she feels well. She denies any previous VTE history. No family history VTE.  No fever, chills, dysuria, flank pain.    Review of Systems  Constitutional:  Negative for appetite change, chills, fatigue and fever.  HENT:   Negative for hearing loss and voice change.   Eyes:  Negative for eye problems.  Respiratory:  Negative for chest tightness and cough.   Cardiovascular:  Negative for chest pain.  Gastrointestinal:  Negative for abdominal distention, abdominal pain and blood in stool.  Endocrine: Negative for hot flashes.  Genitourinary:  Negative for difficulty urinating and frequency.   Musculoskeletal:  Negative for arthralgias.  Skin:  Negative for itching and rash.  Neurological:  Negative for extremity weakness.  Hematological:  Negative for adenopathy.  Psychiatric/Behavioral:  Negative for confusion.    MEDICAL HISTORY:  Past Medical History:  Diagnosis Date   Asthma    BMI 40.0-44.9, adult (HCC)    History of blood transfusion 2019   Recurrent UTI     SURGICAL HISTORY: Past  Surgical History:  Procedure Laterality Date   CESAREAN SECTION     HAND SURGERY      SOCIAL HISTORY: Social History   Socioeconomic History   Marital status: Single    Spouse name: Not on file   Number of children: 4   Years of education: 8   Highest education level: 8th grade  Occupational History   Not on file  Tobacco Use   Smoking status: Never   Smokeless tobacco: Never  Vaping Use   Vaping Use: Never used  Substance and Sexual Activity   Alcohol use: Not Currently    Alcohol/week: 2.0 standard drinks    Types: 2 Shots of liquor per week    Comment: 3-4x/mo   Drug use: Yes    Types: Marijuana    Comment: last use 12/2020   Sexual activity: Yes    Partners: Male    Birth control/protection: Condom  Other Topics Concern   Not on file  Social History Narrative   Not on file   Social Determinants of Health   Financial Resource Strain: Not on file  Food Insecurity: Not on file  Transportation Needs: Not on file  Physical Activity: Not on file  Stress: Not on file  Social Connections: Not on file  Intimate Partner Violence: Not on file    FAMILY HISTORY: Family History  Problem Relation Age of Onset   Hypertension Mother    Cancer Maternal Grandfather     ALLERGIES:  has No Known Allergies.  MEDICATIONS:  Current Outpatient Medications  Medication Sig Dispense Refill   ferrous sulfate (FERROUSUL) 325 (65 FE) MG  tablet Take 1 tablet (325 mg total) by mouth 2 (two) times daily with a meal. 100 tablet 0   rivaroxaban (XARELTO) 20 MG TABS tablet Take 1 tablet (20 mg total) by mouth daily with supper. 30 tablet 1   ibuprofen (ADVIL) 200 MG tablet Take 400-600 mg by mouth every 6 (six) hours as needed for mild pain. (Patient not taking: No sig reported)     RIVAROXABAN (XARELTO) VTE STARTER PACK (15 & 20 MG) Follow package directions: Take one 15mg  tablet by mouth twice a day. On day 22, switch to one 20mg  tablet once a day. Take with food. (Patient not  taking: No sig reported) 51 each 0   No current facility-administered medications for this visit.     PHYSICAL EXAMINATION: ECOG PERFORMANCE STATUS: 0 - Asymptomatic Vitals:   05/04/21 1059  BP: 127/85  Pulse: 78  Resp: 20  Temp: 97.8 F (36.6 C)  SpO2: 100%   Filed Weights   05/04/21 1059  Weight: 242 lb 12.8 oz (110.1 kg)    Physical Exam Constitutional:      General: She is not in acute distress. HENT:     Head: Normocephalic and atraumatic.  Eyes:     General: No scleral icterus. Cardiovascular:     Rate and Rhythm: Normal rate and regular rhythm.     Heart sounds: Normal heart sounds.  Pulmonary:     Effort: Pulmonary effort is normal. No respiratory distress.     Breath sounds: No wheezing.  Abdominal:     General: Bowel sounds are normal. There is no distension.     Palpations: Abdomen is soft.  Musculoskeletal:        General: No deformity. Normal range of motion.     Cervical back: Normal range of motion and neck supple.  Skin:    General: Skin is warm and dry.     Findings: No erythema or rash.  Neurological:     Mental Status: She is alert and oriented to person, place, and time. Mental status is at baseline.     Cranial Nerves: No cranial nerve deficit.     Coordination: Coordination normal.  Psychiatric:        Mood and Affect: Mood normal.    LABORATORY DATA:  I have reviewed the data as listed Lab Results  Component Value Date   WBC 9.7 03/29/2021   HGB 8.3 (L) 03/29/2021   HCT 26.4 (L) 03/29/2021   MCV 68.2 (L) 03/29/2021   PLT 185 03/29/2021   Recent Labs    03/26/21 1934 03/27/21 1328 03/28/21 0543  NA 131* 134* 138  K 3.4* 3.1* 3.6  CL 100 103 105  CO2 21* 22 24  GLUCOSE 135* 130* 121*  BUN 8 10 9   CREATININE 0.65 0.89 0.79  CALCIUM 8.9 8.7* 8.3*  GFRNONAA >60 >60 >60  PROT 8.0 7.9  --   ALBUMIN 4.0 3.6  --   AST 19 20  --   ALT 18 21  --   ALKPHOS 57 59  --   BILITOT 1.3* 1.6*  --    Iron/TIBC/Ferritin/ %Sat     Component Value Date/Time   IRON 27 (L) 03/29/2021 1133   TIBC 309 03/29/2021 1133   FERRITIN 45 03/29/2021 1133   IRONPCTSAT 9 (L) 03/29/2021 1133      RADIOGRAPHIC STUDIES: I have personally reviewed the radiological images as listed and agreed with the findings in the report. No results found.    ASSESSMENT &  PLAN:  1. Thrombosis of ovarian vein   2. Other iron deficiency anemia    # Bilateral ovarian vein thrombosis Likely is due to septic thrombophlebitis due to pelvic inflammation.  Recommend patient complete a total of 6 weeks of anticoagulation  Will obtain hypercoagulable work up after she finishes anticoagulation.   Iron deficiency anemia, she takes oral iron supplementation. Will check cbc iron tibc ferritin  Orders Placed This Encounter  Procedures   Iron and TIBC    Standing Status:   Future    Standing Expiration Date:   05/04/2022   Ferritin    Standing Status:   Future    Standing Expiration Date:   11/01/2021   CBC with Differential/Platelet    Standing Status:   Future    Standing Expiration Date:   05/04/2022   Comprehensive metabolic panel    Standing Status:   Future    Standing Expiration Date:   05/04/2022   ANTIPHOSPHOLIPID SYNDROME PROF    Standing Status:   Future    Standing Expiration Date:   05/04/2022   Prothrombin gene mutation    Standing Status:   Future    Standing Expiration Date:   05/04/2022   Factor 5 leiden    Standing Status:   Future    Standing Expiration Date:   05/04/2022    All questions were answered. The patient knows to call the clinic with any problems questions or concerns.   Vena Austria, MD    Return of visit: 4 weeks  Thank you for this kind referral and the opportunity to participate in the care of this patient. A copy of today's note is routed to referring provider    Rickard Patience, MD, PhD Hematology Oncology [facility]  05/04/2021

## 2021-05-06 ENCOUNTER — Telehealth (HOSPITAL_COMMUNITY): Payer: Self-pay

## 2021-05-06 NOTE — Telephone Encounter (Signed)
Pt. Called concerning a letter she received in the mail about an incidental finding.  Pt. Was an admission in 03/2021 at [facility] and at this time the Admitting Doctor spoke to her about a finding on her Ct scan and she will need to follow -up with her PCP.  Pt. Does not have a PCP.  She then received a letter stating for her to call the Patient Engagement Center.  She did and they instructed her to call the ED clinical Care Coordinator and gave her the number.  I informed pt that I will speak to our Case managers about helping her with a PCP and will call her back with information.

## 2021-05-15 DIAGNOSIS — Z419 Encounter for procedure for purposes other than remedying health state, unspecified: Secondary | ICD-10-CM | POA: Diagnosis not present

## 2021-05-24 ENCOUNTER — Other Ambulatory Visit: Payer: Self-pay

## 2021-05-24 ENCOUNTER — Inpatient Hospital Stay: Payer: Medicaid Other | Attending: Oncology

## 2021-05-24 DIAGNOSIS — Z86718 Personal history of other venous thrombosis and embolism: Secondary | ICD-10-CM | POA: Insufficient documentation

## 2021-05-24 DIAGNOSIS — N289 Disorder of kidney and ureter, unspecified: Secondary | ICD-10-CM | POA: Insufficient documentation

## 2021-05-24 DIAGNOSIS — D611 Drug-induced aplastic anemia: Secondary | ICD-10-CM | POA: Diagnosis not present

## 2021-05-24 DIAGNOSIS — Z79899 Other long term (current) drug therapy: Secondary | ICD-10-CM | POA: Insufficient documentation

## 2021-05-24 DIAGNOSIS — E611 Iron deficiency: Secondary | ICD-10-CM | POA: Diagnosis present

## 2021-05-24 DIAGNOSIS — I82890 Acute embolism and thrombosis of other specified veins: Secondary | ICD-10-CM

## 2021-05-24 LAB — COMPREHENSIVE METABOLIC PANEL
ALT: 18 U/L (ref 0–44)
AST: 19 U/L (ref 15–41)
Albumin: 4 g/dL (ref 3.5–5.0)
Alkaline Phosphatase: 48 U/L (ref 38–126)
Anion gap: 7 (ref 5–15)
BUN: 9 mg/dL (ref 6–20)
CO2: 24 mmol/L (ref 22–32)
Calcium: 8.8 mg/dL — ABNORMAL LOW (ref 8.9–10.3)
Chloride: 102 mmol/L (ref 98–111)
Creatinine, Ser: 0.6 mg/dL (ref 0.44–1.00)
GFR, Estimated: >60 mL/min (ref >60)
Glucose, Bld: 98 mg/dL (ref 70–99)
Potassium: 3.6 mmol/L (ref 3.5–5.1)
Sodium: 133 mmol/L — ABNORMAL LOW (ref 135–145)
Total Bilirubin: 0.3 mg/dL (ref 0.3–1.2)
Total Protein: 7.7 g/dL (ref 6.5–8.1)

## 2021-05-24 LAB — CBC WITH DIFFERENTIAL/PLATELET
Abs Immature Granulocytes: 0.02 10*3/uL (ref 0.00–0.07)
Basophils Absolute: 0 10*3/uL (ref 0.0–0.1)
Basophils Relative: 1 %
Eosinophils Absolute: 0.1 10*3/uL (ref 0.0–0.5)
Eosinophils Relative: 1 %
HCT: 37.5 % (ref 36.0–46.0)
Hemoglobin: 12.5 g/dL (ref 12.0–15.0)
Immature Granulocytes: 0 %
Lymphocytes Relative: 27 %
Lymphs Abs: 2 10*3/uL (ref 0.7–4.0)
MCH: 25.6 pg — ABNORMAL LOW (ref 26.0–34.0)
MCHC: 33.3 g/dL (ref 30.0–36.0)
MCV: 76.8 fL — ABNORMAL LOW (ref 80.0–100.0)
Monocytes Absolute: 0.4 10*3/uL (ref 0.1–1.0)
Monocytes Relative: 5 %
Neutro Abs: 4.9 10*3/uL (ref 1.7–7.7)
Neutrophils Relative %: 66 %
Platelets: 254 10*3/uL (ref 150–400)
RBC: 4.88 MIL/uL (ref 3.87–5.11)
RDW: 20.3 % — ABNORMAL HIGH (ref 11.5–15.5)
WBC: 7.5 10*3/uL (ref 4.0–10.5)
nRBC: 0 % (ref 0.0–0.2)

## 2021-05-24 LAB — IRON AND TIBC
Iron: 31 ug/dL (ref 28–170)
Saturation Ratios: 7 % — ABNORMAL LOW (ref 10.4–31.8)
TIBC: 462 ug/dL — ABNORMAL HIGH (ref 250–450)
UIBC: 431 ug/dL

## 2021-05-24 LAB — FERRITIN: Ferritin: 6 ng/mL — ABNORMAL LOW (ref 11–307)

## 2021-05-25 LAB — ANTIPHOSPHOLIPID SYNDROME PROF
Anticardiolipin IgG: <9 GPL U/mL (ref 0–14)
Anticardiolipin IgM: <9 MPL U/mL (ref 0–12)
DRVVT: 32.6 s (ref 0.0–47.0)
PTT Lupus Anticoagulant: 28.4 s (ref 0.0–51.9)

## 2021-05-28 LAB — FACTOR 5 LEIDEN

## 2021-05-28 LAB — PROTHROMBIN GENE MUTATION

## 2021-06-07 ENCOUNTER — Encounter: Payer: Self-pay | Admitting: Oncology

## 2021-06-07 ENCOUNTER — Inpatient Hospital Stay (HOSPITAL_BASED_OUTPATIENT_CLINIC_OR_DEPARTMENT_OTHER): Payer: Medicaid Other | Admitting: Oncology

## 2021-06-07 ENCOUNTER — Other Ambulatory Visit: Payer: Self-pay

## 2021-06-07 VITALS — BP 125/82 | HR 72 | Temp 97.2°F | Wt 243.6 lb

## 2021-06-07 DIAGNOSIS — D509 Iron deficiency anemia, unspecified: Secondary | ICD-10-CM | POA: Insufficient documentation

## 2021-06-07 DIAGNOSIS — I82890 Acute embolism and thrombosis of other specified veins: Secondary | ICD-10-CM

## 2021-06-07 DIAGNOSIS — Z79899 Other long term (current) drug therapy: Secondary | ICD-10-CM | POA: Diagnosis not present

## 2021-06-07 DIAGNOSIS — D611 Drug-induced aplastic anemia: Secondary | ICD-10-CM | POA: Diagnosis not present

## 2021-06-07 DIAGNOSIS — D500 Iron deficiency anemia secondary to blood loss (chronic): Secondary | ICD-10-CM

## 2021-06-07 DIAGNOSIS — N289 Disorder of kidney and ureter, unspecified: Secondary | ICD-10-CM

## 2021-06-07 DIAGNOSIS — Z86718 Personal history of other venous thrombosis and embolism: Secondary | ICD-10-CM | POA: Diagnosis not present

## 2021-06-07 NOTE — Progress Notes (Signed)
Hematology/Oncology progress note [facility] [phone] Fax:([phone]   Patient Care Team: Patient, No Pcp Per (Inactive) as PCP - General (General Practice)  REFERRING PROVIDER: Vena Austria, MD  CHIEF COMPLAINTS/REASON FOR VISIT:  Follow up for Iron deficiency and  ovarian vein thrombosis  HISTORY OF PRESENTING ILLNESS:   Rachel Clayton is a  30 y.o.  female with PMH listed below was seen in consultation at the request of  Vena Austria, MD  for evaluation of ovarian vein thrombosis  03/27/2021 -03/30/2021 hospitalization due to urosepsis, urine culture is positive for pansensitive E coli.  CT abdomen with concern of bilateral pyelonephritis, she was treated with antibiotics.  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  She was referred to hematology for evaluation.   She is currently on Xarelto 20mg  daily. Today she feels well. She denies any previous VTE history. No family history VTE.  No fever, chills, dysuria, flank pain.    INTERVAL HISTORY Rachel Clayton is a 30 y.o. female who has above history reviewed by me today presents for follow up visit for  iron deficiency anemia and ovarian vein thrombosis Problems and complaints are listed below: Patient has completed total 6 weeks of Xarelto 20 mg daily.  Currently off Xarelto. Was heavy when she was on blood thinner. Fatigue has improved.  Patient is taking oral iron supplementation.  She denies any abdominal/pelvic pain.  Review of Systems  Constitutional:  Negative for appetite change, chills, fatigue and fever.  HENT:   Negative for hearing loss and voice change.   Eyes:  Negative for eye problems.  Respiratory:  Negative for chest tightness and cough.   Cardiovascular:  Negative for chest pain.  Gastrointestinal:  Negative for abdominal distention, abdominal pain and blood in stool.  Endocrine: Negative for hot flashes.   Genitourinary:  Negative for difficulty urinating and frequency.   Musculoskeletal:  Negative for arthralgias.  Skin:  Negative for itching and rash.  Neurological:  Negative for extremity weakness.  Hematological:  Negative for adenopathy.  Psychiatric/Behavioral:  Negative for confusion.    MEDICAL HISTORY:  Past Medical History:  Diagnosis Date   Asthma    BMI 40.0-44.9, adult (HCC)    History of blood transfusion 2019   Recurrent UTI     SURGICAL HISTORY: Past Surgical History:  Procedure Laterality Date   CESAREAN SECTION     HAND SURGERY      SOCIAL HISTORY: Social History   Socioeconomic History   Marital status: Single    Spouse name: Not on file   Number of children: 4   Years of education: 8   Highest education level: 8th grade  Occupational History   Not on file  Tobacco Use   Smoking status: Never   Smokeless tobacco: Never  Vaping Use   Vaping Use: Never used  Substance and Sexual Activity   Alcohol use: Not Currently    Alcohol/week: 2.0 standard drinks    Types: 2 Shots of liquor per week    Comment: 3-4x/mo   Drug use: Yes    Types: Marijuana    Comment: last use 12/2020   Sexual activity: Yes    Partners: Male    Birth control/protection: Condom  Other Topics Concern   Not on file  Social History Narrative   Not on file   Social Determinants of Health   Financial Resource Strain: Not on file  Food Insecurity: Not on file  Transportation Needs: Not on file  Physical Activity: Not on file  Stress: Not on file  Social Connections: Not on file  Intimate Partner Violence: Not on file    FAMILY HISTORY: Family History  Problem Relation Age of Onset   Hypertension Mother    Cancer Maternal Grandfather     ALLERGIES:  has No Known Allergies.  MEDICATIONS:  Current Outpatient Medications  Medication Sig Dispense Refill   rivaroxaban (XARELTO) 20 MG TABS tablet Take 1 tablet (20 mg total) by mouth daily with supper. 30 tablet 1    ferrous sulfate (FERROUSUL) 325 (65 FE) MG tablet Take 1 tablet (325 mg total) by mouth 2 (two) times daily with a meal. 100 tablet 0   RIVAROXABAN (XARELTO) VTE STARTER PACK (15 & 20 MG) Follow package directions: Take one 15mg  tablet by mouth twice a day. On day 22, switch to one 20mg  tablet once a day. Take with food. (Patient not taking: No sig reported) 51 each 0   No current facility-administered medications for this visit.     PHYSICAL EXAMINATION: ECOG PERFORMANCE STATUS: 0 - Asymptomatic Vitals:   06/07/21 1009  BP: 125/82  Pulse: 72  Temp: (!) 97.2 F (36.2 C)  SpO2: 100%   Filed Weights   06/07/21 1009  Weight: 243 lb 9.6 oz (110.5 kg)    Physical Exam Constitutional:      General: She is not in acute distress. HENT:     Head: Normocephalic and atraumatic.  Eyes:     General: No scleral icterus. Cardiovascular:     Rate and Rhythm: Normal rate and regular rhythm.     Heart sounds: Normal heart sounds.  Pulmonary:     Effort: Pulmonary effort is normal. No respiratory distress.     Breath sounds: No wheezing.  Abdominal:     General: Bowel sounds are normal. There is no distension.     Palpations: Abdomen is soft.  Musculoskeletal:        General: No deformity. Normal range of motion.     Cervical back: Normal range of motion and neck supple.  Skin:    General: Skin is warm and dry.     Findings: No erythema or rash.  Neurological:     Mental Status: She is alert and oriented to person, place, and time. Mental status is at baseline.     Cranial Nerves: No cranial nerve deficit.     Coordination: Coordination normal.  Psychiatric:        Mood and Affect: Mood normal.    LABORATORY DATA:  I have reviewed the data as listed Lab Results  Component Value Date   WBC 7.5 05/24/2021   HGB 12.5 05/24/2021   HCT 37.5 05/24/2021   MCV 76.8 (L) 05/24/2021   PLT 254 05/24/2021   Recent Labs    03/26/21 1934 03/27/21 1328 03/28/21 0543 05/24/21 1055  NA  131* 134* 138 133*  K 3.4* 3.1* 3.6 3.6  CL 100 103 105 102  CO2 21* 22 24 24   GLUCOSE 135* 130* 121* 98  BUN 8 10 9 9   CREATININE 0.65 0.89 0.79 0.60  CALCIUM 8.9 8.7* 8.3* 8.8*  GFRNONAA >60 >60 >60 >60  PROT 8.0 7.9  --  7.7  ALBUMIN 4.0 3.6  --  4.0  AST 19 20  --  19  ALT 18 21  --  18  ALKPHOS 57 59  --  48  BILITOT 1.3* 1.6*  --  0.3  Iron/TIBC/Ferritin/ %Sat    Component Value Date/Time   IRON 31 05/24/2021 1055   TIBC 462 (H) 05/24/2021 1055   FERRITIN 6 (L) 05/24/2021 1055   IRONPCTSAT 7 (L) 05/24/2021 1055       RADIOGRAPHIC STUDIES: I have personally reviewed the radiological images as listed and agreed with the findings in the report. No results found.    ASSESSMENT & PLAN:  1. Thrombosis of ovarian vein   2. Iron deficiency anemia due to chronic blood loss   3. Renal lesion   4. 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  S/p 6 weeks of anticoagulation with Xarelto. Discontinue Xarelto. Hypercoagulable work-up is negative.  Iron deficiency anemia, labs reviewed and discussed with patient.  Hemoglobin has normalized.  Persistent iron deficient. Recommend patient continue oral iron supplementation ferrous sulfate 325 mg twice daily.  Kidney lesion on CT.  I will obtain MRI abdomen renal protocol for further evaluation  Fatty liver disease/borderline splenomegaly.  Discussed with patient.  Patient has decreased alcohol consumption.   Follow-up in 6 months. Orders Placed This Encounter  Procedures   MR Abdomen W Wo Contrast    Standing Status:   Future    Standing Expiration Date:   06/07/2022    Order Specific Question:   If indicated for the ordered procedure, I authorize the administration of contrast media per Radiology protocol    Answer:   Yes    Order Specific Question:   What is the patient's sedation requirement?    Answer:   No Sedation    Order Specific  Question:   Does the patient have a pacemaker or implanted devices?    Answer:   No    Order Specific Question:   Preferred imaging location?    Answer:   [facility] (table limit - 550lbs)   CBC with Differential/Platelet    Standing Status:   Future    Standing Expiration Date:   06/07/2022   Comprehensive metabolic panel    Standing Status:   Future    Standing Expiration Date:   06/07/2022   Iron and TIBC    Standing Status:   Future    Standing Expiration Date:   06/07/2022   Ferritin    Standing Status:   Future    Standing Expiration Date:   06/07/2022    All questions were answered. The patient knows to call the clinic with any problems questions or concerns.  cc Vena Austria, MD    Rickard Patience, MD, PhD Hematology Oncology [facility]  06/07/2021

## 2021-06-15 DIAGNOSIS — Z419 Encounter for procedure for purposes other than remedying health state, unspecified: Secondary | ICD-10-CM | POA: Diagnosis not present

## 2021-06-17 ENCOUNTER — Encounter: Payer: Self-pay | Admitting: Oncology

## 2021-06-17 ENCOUNTER — Ambulatory Visit
Admission: RE | Admit: 2021-06-17 | Discharge: 2021-06-17 | Disposition: A | Discharge status: Home / Self Care | Payer: Medicaid Other | Source: Ambulatory Visit | Attending: Oncology | Admitting: Oncology

## 2021-06-17 ENCOUNTER — Other Ambulatory Visit: Payer: Self-pay

## 2021-06-17 DIAGNOSIS — N289 Disorder of kidney and ureter, unspecified: Secondary | ICD-10-CM | POA: Insufficient documentation

## 2021-06-17 DIAGNOSIS — N281 Cyst of kidney, acquired: Secondary | ICD-10-CM | POA: Diagnosis not present

## 2021-06-17 MED ORDER — GADOBUTROL 1 MMOL/ML IV SOLN
10.0000 mL | Freq: Once | INTRAVENOUS | Status: AC | PRN
  Administered 2021-06-17: 10 mL via INTRAVENOUS

## 2021-06-18 NOTE — Telephone Encounter (Signed)
Please advise. Pt does not have follow up until April 2023.

## 2021-06-29 DIAGNOSIS — Z03818 Encounter for observation for suspected exposure to other biological agents ruled out: Secondary | ICD-10-CM | POA: Diagnosis not present

## 2021-06-29 DIAGNOSIS — J101 Influenza due to other identified influenza virus with other respiratory manifestations: Secondary | ICD-10-CM | POA: Diagnosis not present

## 2021-07-15 DIAGNOSIS — Z419 Encounter for procedure for purposes other than remedying health state, unspecified: Secondary | ICD-10-CM | POA: Diagnosis not present

## 2021-08-15 DIAGNOSIS — Z419 Encounter for procedure for purposes other than remedying health state, unspecified: Secondary | ICD-10-CM | POA: Diagnosis not present

## 2021-09-15 DIAGNOSIS — Z419 Encounter for procedure for purposes other than remedying health state, unspecified: Secondary | ICD-10-CM | POA: Diagnosis not present

## 2021-10-13 DIAGNOSIS — Z419 Encounter for procedure for purposes other than remedying health state, unspecified: Secondary | ICD-10-CM | POA: Diagnosis not present

## 2021-11-09 LAB — FETAL NONSTRESS TEST

## 2021-11-13 DIAGNOSIS — Z419 Encounter for procedure for purposes other than remedying health state, unspecified: Secondary | ICD-10-CM | POA: Diagnosis not present

## 2021-12-06 ENCOUNTER — Inpatient Hospital Stay: Payer: Medicaid Other | Attending: Oncology

## 2021-12-06 DIAGNOSIS — I82890 Acute embolism and thrombosis of other specified veins: Secondary | ICD-10-CM

## 2021-12-06 DIAGNOSIS — R161 Splenomegaly, not elsewhere classified: Secondary | ICD-10-CM | POA: Diagnosis not present

## 2021-12-06 DIAGNOSIS — N281 Cyst of kidney, acquired: Secondary | ICD-10-CM | POA: Insufficient documentation

## 2021-12-06 DIAGNOSIS — K760 Fatty (change of) liver, not elsewhere classified: Secondary | ICD-10-CM | POA: Insufficient documentation

## 2021-12-06 DIAGNOSIS — Z79899 Other long term (current) drug therapy: Secondary | ICD-10-CM | POA: Insufficient documentation

## 2021-12-06 DIAGNOSIS — Z86718 Personal history of other venous thrombosis and embolism: Secondary | ICD-10-CM | POA: Insufficient documentation

## 2021-12-06 DIAGNOSIS — D509 Iron deficiency anemia, unspecified: Secondary | ICD-10-CM | POA: Insufficient documentation

## 2021-12-06 LAB — COMPREHENSIVE METABOLIC PANEL
ALT: 17 U/L (ref 0–44)
AST: 19 U/L (ref 15–41)
Albumin: 3.8 g/dL (ref 3.5–5.0)
Alkaline Phosphatase: 44 U/L (ref 38–126)
Anion gap: 8 (ref 5–15)
BUN: 9 mg/dL (ref 6–20)
CO2: 24 mmol/L (ref 22–32)
Calcium: 9.1 mg/dL (ref 8.9–10.3)
Chloride: 105 mmol/L (ref 98–111)
Creatinine, Ser: 0.79 mg/dL (ref 0.44–1.00)
GFR, Estimated: >60 mL/min (ref >60)
Glucose, Bld: 109 mg/dL — ABNORMAL HIGH (ref 70–99)
Potassium: 4.1 mmol/L (ref 3.5–5.1)
Sodium: 137 mmol/L (ref 135–145)
Total Bilirubin: 0.2 mg/dL — ABNORMAL LOW (ref 0.3–1.2)
Total Protein: 7.1 g/dL (ref 6.5–8.1)

## 2021-12-06 LAB — CBC WITH DIFFERENTIAL/PLATELET
Abs Immature Granulocytes: 0.02 10*3/uL (ref 0.00–0.07)
Basophils Absolute: 0 10*3/uL (ref 0.0–0.1)
Basophils Relative: 1 %
Eosinophils Absolute: 0.1 10*3/uL (ref 0.0–0.5)
Eosinophils Relative: 2 %
HCT: 38.9 % (ref 36.0–46.0)
Hemoglobin: 12.7 g/dL (ref 12.0–15.0)
Immature Granulocytes: 0 %
Lymphocytes Relative: 27 %
Lymphs Abs: 1.7 10*3/uL (ref 0.7–4.0)
MCH: 26.5 pg (ref 26.0–34.0)
MCHC: 32.6 g/dL (ref 30.0–36.0)
MCV: 81 fL (ref 80.0–100.0)
Monocytes Absolute: 0.3 10*3/uL (ref 0.1–1.0)
Monocytes Relative: 5 %
Neutro Abs: 4.1 10*3/uL (ref 1.7–7.7)
Neutrophils Relative %: 65 %
Platelets: 251 10*3/uL (ref 150–400)
RBC: 4.8 MIL/uL (ref 3.87–5.11)
RDW: 14.4 % (ref 11.5–15.5)
WBC: 6.4 10*3/uL (ref 4.0–10.5)
nRBC: 0 % (ref 0.0–0.2)

## 2021-12-06 LAB — IRON AND TIBC
Iron: 28 ug/dL (ref 28–170)
Saturation Ratios: 7 % — ABNORMAL LOW (ref 10.4–31.8)
TIBC: 423 ug/dL (ref 250–450)
UIBC: 395 ug/dL

## 2021-12-06 LAB — FERRITIN: Ferritin: 3 ng/mL — ABNORMAL LOW (ref 11–307)

## 2021-12-08 ENCOUNTER — Encounter: Payer: Self-pay | Admitting: Oncology

## 2021-12-08 ENCOUNTER — Inpatient Hospital Stay (HOSPITAL_BASED_OUTPATIENT_CLINIC_OR_DEPARTMENT_OTHER): Payer: Medicaid Other | Admitting: Oncology

## 2021-12-08 VITALS — BP 123/85 | HR 70 | Temp 97.7°F | Wt 241.0 lb

## 2021-12-08 DIAGNOSIS — I82890 Acute embolism and thrombosis of other specified veins: Secondary | ICD-10-CM | POA: Diagnosis not present

## 2021-12-08 DIAGNOSIS — Z79899 Other long term (current) drug therapy: Secondary | ICD-10-CM | POA: Diagnosis not present

## 2021-12-08 DIAGNOSIS — K760 Fatty (change of) liver, not elsewhere classified: Secondary | ICD-10-CM | POA: Diagnosis not present

## 2021-12-08 DIAGNOSIS — Z86718 Personal history of other venous thrombosis and embolism: Secondary | ICD-10-CM | POA: Diagnosis not present

## 2021-12-08 DIAGNOSIS — R161 Splenomegaly, not elsewhere classified: Secondary | ICD-10-CM | POA: Diagnosis not present

## 2021-12-08 DIAGNOSIS — N281 Cyst of kidney, acquired: Secondary | ICD-10-CM | POA: Diagnosis not present

## 2021-12-08 DIAGNOSIS — D500 Iron deficiency anemia secondary to blood loss (chronic): Secondary | ICD-10-CM

## 2021-12-08 DIAGNOSIS — D509 Iron deficiency anemia, unspecified: Secondary | ICD-10-CM | POA: Diagnosis not present

## 2021-12-08 MED ORDER — VITAMIN C 500 MG PO CAPS: 1.0000 | ORAL_CAPSULE | Freq: Every day | ORAL | 1 refills | Status: DC

## 2021-12-08 MED ORDER — FERROUS SULFATE 325 (65 FE) MG PO TABS: 325.0000 mg | ORAL_TABLET | Freq: Two times a day (BID) | ORAL | 1 refills | Status: DC

## 2021-12-08 NOTE — Progress Notes (Signed)
?Hematology/Oncology Progress note ?[phone] Fax:([phone]) [phone] ?  ? ? ? ?Patient Care Team: ?Patient, No Pcp Per (Inactive) as PCP - General (General Practice) ? ?REFERRING PROVIDER: ?No ref. provider found  ?CHIEF COMPLAINTS/REASON FOR VISIT:  ?Follow up for Iron deficiency and  ovarian vein thrombosis ? ?HISTORY OF PRESENTING ILLNESS:  ? ?Rachel Clayton is a  31 y.o.  female with PMH listed below was seen in consultation at the request of  No ref. provider found  for evaluation of ovarian vein thrombosis ? ?03/27/2021 -03/30/2021 hospitalization due to urosepsis, urine culture is positive for pansensitive E coli.  CT abdomen with concern of bilateral pyelonephritis, she was treated with antibiotics.  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  ?She was referred to hematology for evaluation.  ? ?Patient completed 6 weeks of Xarelto 20 mg daily for provoked ovarian vein thrombosis.  Off Xarelto currently. ? ? ?INTERVAL HISTORY ?Rachel Clayton is a 31 y.o. female who has above history reviewed by me today presents for follow up visit for  iron deficiency anemia and ovarian vein thrombosis ?Currently off Xarelto.  She reports feeling well.  No new complaints.  Denies any abdominal pain. ? ?Review of Systems  ?Constitutional:  Negative for appetite change, chills, fatigue and fever.  ?HENT:   Negative for hearing loss and voice change.   ?Eyes:  Negative for eye problems.  ?Respiratory:  Negative for chest tightness and cough.   ?Cardiovascular:  Negative for chest pain.  ?Gastrointestinal:  Negative for abdominal distention, abdominal pain and blood in stool.  ?Endocrine: Negative for hot flashes.  ?Genitourinary:  Negative for difficulty urinating and frequency.   ?Musculoskeletal:  Negative for arthralgias.  ?Skin:  Negative for itching and rash.  ?Neurological:  Negative for extremity weakness.  ?Hematological:  Negative for adenopathy.   ?Psychiatric/Behavioral:  Negative for confusion.   ? ?MEDICAL HISTORY:  ?Past Medical History:  ?Diagnosis Date  ? Asthma   ? BMI 40.0-44.9, adult ([place])   ? History of blood transfusion 2019  ? Recurrent UTI   ? ? ?SURGICAL HISTORY: ?Past Surgical History:  ?Procedure Laterality Date  ? CESAREAN SECTION    ? HAND SURGERY    ? ? ?SOCIAL HISTORY: ?Social History  ? ?Socioeconomic History  ? Marital status: Single  ?  Spouse name: Not on file  ? Number of children: 4  ? Years of education: 8  ? Highest education level: 8th grade  ?Occupational History  ? Not on file  ?Tobacco Use  ? Smoking status: Never  ? Smokeless tobacco: Never  ?Vaping Use  ? Vaping Use: Never used  ?Substance and Sexual Activity  ? Alcohol use: Not Currently  ?  Alcohol/week: 2.0 standard drinks  ?  Types: 2 Shots of liquor per week  ?  Comment: 3-4x/mo  ? Drug use: Yes  ?  Types: Marijuana  ?  Comment: last use 12/2020  ? Sexual activity: Yes  ?  Partners: Male  ?  Birth control/protection: Condom  ?Other Topics Concern  ? Not on file  ?Social History Narrative  ? Not on file  ? ?Social Determinants of Health  ? ?Financial Resource Strain: Not on file  ?Food Insecurity: Not on file  ?Transportation Needs: Not on file  ?Physical Activity: Not on file  ?Stress: Not on file  ?Social Connections: Not on file  ?Intimate Partner Violence: Not on file  ? ? ?FAMILY HISTORY: ?Family History  ?  Problem Relation Age of Onset  ? Hypertension Mother   ? Cancer Maternal Grandfather   ? ? ?ALLERGIES:  has No Known Allergies. ? ?MEDICATIONS:  ?Current Outpatient Medications  ?Medication Sig Dispense Refill  ? Ascorbic Acid (VITAMIN C) 500 MG CAPS Take 1 capsule by mouth daily. 90 capsule 1  ? ferrous sulfate (FERROUSUL) 325 (65 FE) MG tablet Take 1 tablet (325 mg total) by mouth 2 (two) times daily with a meal. 180 tablet 1  ? ?No current facility-administered medications for this visit.  ? ? ? ?PHYSICAL EXAMINATION: ?ECOG PERFORMANCE STATUS: 0 -  Asymptomatic ?Vitals:  ? 12/08/21 1317  ?BP: 123/85  ?Pulse: 70  ?Temp: 97.7 ?F (36.5 ?C)  ? ?Filed Weights  ? 12/08/21 1317  ?Weight: 241 lb (109.3 kg)  ? ? ?Physical Exam ?Constitutional:   ?   General: She is not in acute distress. ?HENT:  ?   Head: Normocephalic and atraumatic.  ?Eyes:  ?   General: No scleral icterus. ?Cardiovascular:  ?   Rate and Rhythm: Normal rate and regular rhythm.  ?   Heart sounds: Normal heart sounds.  ?Pulmonary:  ?   Effort: Pulmonary effort is normal. No respiratory distress.  ?   Breath sounds: No wheezing.  ?Abdominal:  ?   General: Bowel sounds are normal. There is no distension.  ?   Palpations: Abdomen is soft.  ?Musculoskeletal:     ?   General: No deformity. Normal range of motion.  ?   Cervical back: Normal range of motion and neck supple.  ?Skin: ?   General: Skin is warm and dry.  ?   Findings: No erythema or rash.  ?Neurological:  ?   Mental Status: She is alert and oriented to person, place, and time. Mental status is at baseline.  ?   Cranial Nerves: No cranial nerve deficit.  ?   Coordination: Coordination normal.  ?Psychiatric:     ?   Mood and Affect: Mood normal.  ? ? ?LABORATORY DATA:  ?I have reviewed the data as listed ?Lab Results  ?Component Value Date  ? WBC 6.4 12/06/2021  ? HGB 12.7 12/06/2021  ? HCT 38.9 12/06/2021  ? MCV 81.0 12/06/2021  ? PLT 251 12/06/2021  ? ?Recent Labs  ?  03/27/21 ?1328 03/28/21 ?0543 05/24/21 ?1055 12/06/21 ?1257  ?NA 134* 138 133* 137  ?K 3.1* 3.6 3.6 4.1  ?CL 103 105 102 105  ?CO2 22 24 24 24   ?GLUCOSE 130* 121* 98 109*  ?BUN 10 9 9 9   ?CREATININE 0.89 0.79 0.60 0.79  ?CALCIUM 8.7* 8.3* 8.8* 9.1  ?GFRNONAA >60 >60 >60 >60  ?PROT 7.9  --  7.7 7.1  ?ALBUMIN 3.6  --  4.0 3.8  ?AST 20  --  19 19  ?ALT 21  --  18 17  ?ALKPHOS 59  --  48 44  ?BILITOT 1.6*  --  0.3 0.2*  ? ? ?Iron/TIBC/Ferritin/ %Sat ?   ?Component Value Date/Time  ? IRON 28 12/06/2021 1257  ? TIBC 423 12/06/2021 1257  ? FERRITIN 3 (L) 12/06/2021 1257  ? IRONPCTSAT 7 (L)  12/06/2021 1257  ? ?  ? ? ?RADIOGRAPHIC STUDIES: ?I have personally reviewed the radiological images as listed and agreed with the findings in the report. ?No results found. ? ? ? ?ASSESSMENT & PLAN:  ?1. Iron deficiency anemia due to chronic blood loss   ?2. Thrombosis of ovarian vein   ?3. Renal cyst, left   ? 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 Hypercoagulable work-up is negative. ?S/p 6 weeks of anticoagulation with Xarelto. ?Clinically doing very well. ? ? ?Iron deficiency anemia, ?Labs reviewed and discussed with patient. ?Hemoglobin remains normal ?Persistent iron deficiency with a ferritin of 3, iron saturation 7.  Reviewed option of IV Venofer treatments.  Patient prefers to stay on oral iron supplementation.  Continue ferrous sulfate 325 mg twice daily, add vitamin C 500 mcg daily.  Prescription sent to pharmacy.  Check celiac panel at the next visit. ? ?Kidney lesion on CT.   ?06/17/21 MRI abdomen showed 1.5 cm benign Bosniak category 2 hemorrhagic cyst in the upper pole of the left kidney.  No evidence of renal neoplasm.  Hold off additional work-up. ? ?Fatty liver disease/borderline splenomegaly.  Recommend avoid alcohol. ? ?Follow-up in 6 months. ?Orders Placed This Encounter  ?Procedures  ? CBC with Differential/Platelet  ?  Standing Status:   Future  ?  Standing Expiration Date:   12/09/2022  ? Ferritin  ?  Standing Status:   Future  ?  Standing Expiration Date:   12/09/2022  ? Iron and TIBC  ?  Standing Status:   Future  ?  Standing Expiration Date:   12/09/2022  ? Celiac panel 10  ?  Standing Status:   Future  ?  Standing Expiration Date:   12/09/2022  ?  ?All questions were answered. The patient knows to call the clinic with any problems questions or concerns. ? ?Follow-up in 6 months, iron labs prior to MD visit. ? ?Earlie Server, MD, PhD ?Hematology Oncology ?[place] [facility] ? ?12/08/2021 ? ? ?

## 2021-12-09 ENCOUNTER — Encounter: Payer: Self-pay | Admitting: Oncology

## 2021-12-13 DIAGNOSIS — Z419 Encounter for procedure for purposes other than remedying health state, unspecified: Secondary | ICD-10-CM | POA: Diagnosis not present

## 2022-01-13 DIAGNOSIS — Z419 Encounter for procedure for purposes other than remedying health state, unspecified: Secondary | ICD-10-CM | POA: Diagnosis not present

## 2022-02-12 DIAGNOSIS — Z419 Encounter for procedure for purposes other than remedying health state, unspecified: Secondary | ICD-10-CM | POA: Diagnosis not present

## 2022-03-15 DIAGNOSIS — Z419 Encounter for procedure for purposes other than remedying health state, unspecified: Secondary | ICD-10-CM | POA: Diagnosis not present

## 2022-04-15 DIAGNOSIS — Z419 Encounter for procedure for purposes other than remedying health state, unspecified: Secondary | ICD-10-CM | POA: Diagnosis not present

## 2022-05-15 DIAGNOSIS — Z419 Encounter for procedure for purposes other than remedying health state, unspecified: Secondary | ICD-10-CM | POA: Diagnosis not present

## 2022-06-08 ENCOUNTER — Inpatient Hospital Stay: Payer: Medicaid Other | Attending: Oncology

## 2022-06-08 DIAGNOSIS — K760 Fatty (change of) liver, not elsewhere classified: Secondary | ICD-10-CM | POA: Diagnosis not present

## 2022-06-08 DIAGNOSIS — Z7901 Long term (current) use of anticoagulants: Secondary | ICD-10-CM | POA: Insufficient documentation

## 2022-06-08 DIAGNOSIS — D500 Iron deficiency anemia secondary to blood loss (chronic): Secondary | ICD-10-CM

## 2022-06-08 DIAGNOSIS — Z86718 Personal history of other venous thrombosis and embolism: Secondary | ICD-10-CM | POA: Diagnosis not present

## 2022-06-08 DIAGNOSIS — D509 Iron deficiency anemia, unspecified: Secondary | ICD-10-CM | POA: Insufficient documentation

## 2022-06-08 DIAGNOSIS — R161 Splenomegaly, not elsewhere classified: Secondary | ICD-10-CM | POA: Insufficient documentation

## 2022-06-08 DIAGNOSIS — Z79899 Other long term (current) drug therapy: Secondary | ICD-10-CM | POA: Diagnosis not present

## 2022-06-08 LAB — CBC WITH DIFFERENTIAL/PLATELET
Abs Immature Granulocytes: 0.02 10*3/uL (ref 0.00–0.07)
Basophils Absolute: 0 10*3/uL (ref 0.0–0.1)
Basophils Relative: 0 %
Eosinophils Absolute: 0.2 10*3/uL (ref 0.0–0.5)
Eosinophils Relative: 2 %
HCT: 40.3 % (ref 36.0–46.0)
Hemoglobin: 13.5 g/dL (ref 12.0–15.0)
Immature Granulocytes: 0 %
Lymphocytes Relative: 20 %
Lymphs Abs: 1.6 10*3/uL (ref 0.7–4.0)
MCH: 28.6 pg (ref 26.0–34.0)
MCHC: 33.5 g/dL (ref 30.0–36.0)
MCV: 85.4 fL (ref 80.0–100.0)
Monocytes Absolute: 0.3 10*3/uL (ref 0.1–1.0)
Monocytes Relative: 4 %
Neutro Abs: 5.7 10*3/uL (ref 1.7–7.7)
Neutrophils Relative %: 74 %
Platelets: 258 10*3/uL (ref 150–400)
RBC: 4.72 MIL/uL (ref 3.87–5.11)
RDW: 13.5 % (ref 11.5–15.5)
WBC: 7.8 10*3/uL (ref 4.0–10.5)
nRBC: 0 % (ref 0.0–0.2)

## 2022-06-08 LAB — IRON AND TIBC
Iron: 62 ug/dL (ref 28–170)
Saturation Ratios: 15 % (ref 10.4–31.8)
TIBC: 403 ug/dL (ref 250–450)
UIBC: 341 ug/dL

## 2022-06-08 LAB — FERRITIN: Ferritin: 11 ng/mL (ref 11–307)

## 2022-06-09 LAB — CELIAC PANEL 10
Antigliadin Abs, IgA: 4 units (ref 0–19)
Endomysial Ab, IgA: NEGATIVE
Gliadin IgG: 4 units (ref 0–19)
IgA: 199 mg/dL (ref 87–352)
Tissue Transglut Ab: 4 U/mL (ref 0–5)
Tissue Transglutaminase Ab, IgA: <2 U/mL (ref 0–3)

## 2022-06-10 ENCOUNTER — Encounter: Payer: Self-pay | Admitting: Oncology

## 2022-06-10 ENCOUNTER — Inpatient Hospital Stay (HOSPITAL_BASED_OUTPATIENT_CLINIC_OR_DEPARTMENT_OTHER): Payer: Medicaid Other | Admitting: Oncology

## 2022-06-10 VITALS — BP 124/87 | HR 71 | Temp 96.6°F | Resp 18 | Wt 253.1 lb

## 2022-06-10 DIAGNOSIS — Z7901 Long term (current) use of anticoagulants: Secondary | ICD-10-CM | POA: Diagnosis not present

## 2022-06-10 DIAGNOSIS — Z79899 Other long term (current) drug therapy: Secondary | ICD-10-CM | POA: Diagnosis not present

## 2022-06-10 DIAGNOSIS — D500 Iron deficiency anemia secondary to blood loss (chronic): Secondary | ICD-10-CM

## 2022-06-10 DIAGNOSIS — D509 Iron deficiency anemia, unspecified: Secondary | ICD-10-CM | POA: Diagnosis not present

## 2022-06-10 DIAGNOSIS — K760 Fatty (change of) liver, not elsewhere classified: Secondary | ICD-10-CM

## 2022-06-10 DIAGNOSIS — R161 Splenomegaly, not elsewhere classified: Secondary | ICD-10-CM | POA: Diagnosis not present

## 2022-06-10 DIAGNOSIS — Z86718 Personal history of other venous thrombosis and embolism: Secondary | ICD-10-CM | POA: Diagnosis not present

## 2022-06-11 ENCOUNTER — Encounter: Payer: Self-pay | Admitting: Oncology

## 2022-06-11 DIAGNOSIS — K760 Fatty (change of) liver, not elsewhere classified: Secondary | ICD-10-CM | POA: Insufficient documentation

## 2022-06-11 NOTE — Progress Notes (Signed)
Hematology/Oncology Progress note [phone] Fax:(336) [phone]      Patient Care Team: Patient, No Pcp Per as PCP - General (General Practice) Rickard Patience, MD as Consulting Physician (Oncology)  ASSESSMENT & PLAN:   IDA (iron deficiency anemia) Labs reviewed and discussed with patient. Hemoglobin remains normal Iron panel has improved. Recommend patient to continue ferrous sulfate 325 mg twice daily  Fatty liver And borderline splenomegaly Avoid alcohol, healthy diet and excercise  Orders Placed This Encounter  Procedures   CBC with Differential/Platelet    Standing Status:   Future    Standing Expiration Date:   06/11/2023   Iron and TIBC    Standing Status:   Future    Standing Expiration Date:   06/11/2023   Ferritin    Standing Status:   Future    Standing Expiration Date:   06/11/2023   1 year follow up All questions were answered. The patient knows to call the clinic with any problems, questions or concerns.  Rickard Patience, MD, PhD [facility] Hematology Oncology 06/10/2022   CHIEF COMPLAINTS/REASON FOR VISIT:  Follow up for Iron deficiency   HISTORY OF PRESENTING ILLNESS:   Rachel Clayton is a  31 y.o.  female with PMH listed below was seen in consultation at the request of  No ref. 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  CT abdomen with concern of bilateral pyelonephritis, she was treated with antibiotics.  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  She was referred to hematology for evaluation.   Patient completed 6 weeks of Xarelto 20 mg daily for provoked ovarian vein thrombosis.  Currently off Xarelto.   Kidney lesion on CT.   06/17/21 MRI abdomen showed 1.5 cm benign Bosniak category 2 hemorrhagic cyst in the upper pole of the left kidney.  No evidence of renal neoplasm.   Hold off additional work-up.  INTERVAL HISTORY Rachel Clayton is a 31 y.o. female who has above history reviewed by me today presents for follow up visit for  iron deficiency anemia and ovarian vein thrombosis  She reports feeling well.  No new complaints.  Denies any abdominal pain.  Review of Systems  Constitutional:  Negative for appetite change, chills, fatigue and fever.  HENT:   Negative for hearing loss and voice change.   Eyes:  Negative for eye problems.  Respiratory:  Negative for chest tightness and cough.   Cardiovascular:  Negative for chest pain.  Gastrointestinal:  Negative for abdominal distention, abdominal pain and blood in stool.  Endocrine: Negative for hot flashes.  Genitourinary:  Negative for difficulty urinating and frequency.   Musculoskeletal:  Negative for arthralgias.  Skin:  Negative for itching and rash.  Neurological:  Negative for extremity weakness.  Hematological:  Negative for adenopathy.  Psychiatric/Behavioral:  Negative for confusion.     MEDICAL HISTORY:  Past Medical History:  Diagnosis Date   Asthma    BMI 40.0-44.9, adult (HCC)    History of blood transfusion 2019   Recurrent UTI     SURGICAL HISTORY: Past Surgical History:  Procedure Laterality Date   CESAREAN SECTION     HAND SURGERY      SOCIAL HISTORY: Social History   Socioeconomic History   Marital status: Single    Spouse name: Not on file   Number of children: 4   Years of  education: 8   Highest education level: 8th grade  Occupational History   Not on file  Tobacco Use   Smoking status: Never   Smokeless tobacco: Never  Vaping Use   Vaping Use: Never used  Substance and Sexual Activity   Alcohol use: Not Currently    Alcohol/week: 2.0 standard drinks of alcohol    Types: 2 Shots of liquor per week    Comment: 3-4x/mo   Drug use: Yes    Types: Marijuana    Comment: last use 12/2020   Sexual activity: Yes    Partners: Male    Birth control/protection:  Condom  Other Topics Concern   Not on file  Social History Narrative   Not on file   Social Determinants of Health   Financial Resource Strain: Not on file  Food Insecurity: Not on file  Transportation Needs: Not on file  Physical Activity: Not on file  Stress: Not on file  Social Connections: Not on file  Intimate Partner Violence: Not on file    FAMILY HISTORY: Family History  Problem Relation Age of Onset   Hypertension Mother    Cancer Maternal Grandfather     ALLERGIES:  has No Known Allergies.  MEDICATIONS:  Current Outpatient Medications  Medication Sig Dispense Refill   Ascorbic Acid (VITAMIN C) 500 MG CAPS Take 1 capsule by mouth daily. 90 capsule 1   ferrous sulfate (FERROUSUL) 325 (65 FE) MG tablet Take 1 tablet (325 mg total) by mouth 2 (two) times daily with a meal. 180 tablet 1   ibuprofen (ADVIL) 600 MG tablet Take 1 tablet by mouth every 6 (six) hours as needed.     predniSONE (DELTASONE) 10 MG tablet TAKE 6 TABLETS ON DAY 1 AS DIRECTED AND DECREASE BY 1 TAB EACH DAY FOR A TOTAL OF 6 DAYS (Patient not taking: Reported on 06/10/2022)     No current facility-administered medications for this visit.     PHYSICAL EXAMINATION: ECOG PERFORMANCE STATUS: 0 - Asymptomatic Vitals:   06/10/22 1148  BP: 124/87  Pulse: 71  Resp: 18  Temp: (!) 96.6 F (35.9 C)  SpO2: 100%   Filed Weights   06/10/22 1148  Weight: 253 lb 1.6 oz (114.8 kg)    Physical Exam Constitutional:      General: She is not in acute distress. HENT:     Head: Normocephalic and atraumatic.  Eyes:     General: No scleral icterus. Cardiovascular:     Rate and Rhythm: Normal rate and regular rhythm.     Heart sounds: Normal heart sounds.  Pulmonary:     Effort: Pulmonary effort is normal. No respiratory distress.     Breath sounds: No wheezing.  Abdominal:     General: Bowel sounds are normal. There is no distension.     Palpations: Abdomen is soft.  Musculoskeletal:         General: No deformity. Normal range of motion.     Cervical back: Normal range of motion and neck supple.  Skin:    General: Skin is warm and dry.     Findings: No erythema or rash.  Neurological:     Mental Status: She is alert and oriented to person, place, and time. Mental status is at baseline.     Cranial Nerves: No cranial nerve deficit.     Coordination: Coordination normal.  Psychiatric:        Mood and Affect: Mood normal.     LABORATORY DATA:  I have  reviewed the data as listed    Latest Ref Rng & Units 06/08/2022    1:01 PM 12/06/2021   12:57 PM 05/24/2021   10:55 AM  CBC  WBC 4.0 - 10.5 K/uL 7.8  6.4  7.5   Hemoglobin 12.0 - 15.0 g/dL 92.1  19.4  17.4   Hematocrit 36.0 - 46.0 % 40.3  38.9  37.5   Platelets 150 - 400 K/uL 258  251  254       Latest Ref Rng & Units 12/06/2021   12:57 PM 05/24/2021   10:55 AM 03/28/2021    5:43 AM  CMP  Glucose 70 - 99 mg/dL 081  98  448   BUN 6 - 20 mg/dL 9  9  9    Creatinine 0.44 - 1.00 mg/dL  1.85  6.31   Sodium 135 - 145 mmol/L 137  133  138   Potassium 3.5 - 5.1 mmol/L 4.1  3.6  3.6   Chloride 98 - 111 mmol/L 105  102  105   CO2 22 - 32 mmol/L 24  24  24    Calcium 8.9 - 10.3 mg/dL 9.1  8.8  8.3   Total Protein 6.5 - 8.1 g/dL 7.1  7.7    Total Bilirubin 0.3 - 1.2 mg/dL 0.2  0.3    Alkaline Phos 38 - 126 U/L 44  48    AST 15 - 41 U/L 19  19    ALT 0 - 44 U/L 17  18       Iron/TIBC/Ferritin/ %Sat    Component Value Date/Time   IRON 62 06/08/2022 1301   TIBC 403 06/08/2022 1301   FERRITIN 11 06/08/2022 1301   IRONPCTSAT 15 06/08/2022 1301       RADIOGRAPHIC STUDIES: I have personally reviewed the radiological images as listed and agreed with the findings in the report. No results found.

## 2022-06-11 NOTE — Assessment & Plan Note (Signed)
Labs reviewed and discussed with patient. Hemoglobin remains normal Iron panel has improved. Recommend patient to continue ferrous sulfate 325 mg twice daily

## 2022-06-11 NOTE — Assessment & Plan Note (Signed)
And borderline splenomegaly Avoid alcohol, healthy diet and excercise

## 2022-06-15 DIAGNOSIS — Z419 Encounter for procedure for purposes other than remedying health state, unspecified: Secondary | ICD-10-CM | POA: Diagnosis not present

## 2022-06-27 ENCOUNTER — Encounter: Payer: Self-pay | Admitting: Emergency Medicine

## 2022-06-27 ENCOUNTER — Other Ambulatory Visit: Payer: Self-pay

## 2022-06-27 ENCOUNTER — Emergency Department: Payer: Medicaid Other

## 2022-06-27 ENCOUNTER — Emergency Department
Admission: EM | Admit: 2022-06-27 | Discharge: 2022-06-27 | Disposition: A | Discharge status: Home / Self Care | Payer: Medicaid Other | Attending: Emergency Medicine | Admitting: Emergency Medicine

## 2022-06-27 DIAGNOSIS — S99911A Unspecified injury of right ankle, initial encounter: Secondary | ICD-10-CM | POA: Diagnosis not present

## 2022-06-27 DIAGNOSIS — S93401A Sprain of unspecified ligament of right ankle, initial encounter: Secondary | ICD-10-CM | POA: Insufficient documentation

## 2022-06-27 DIAGNOSIS — X501XXA Overexertion from prolonged static or awkward postures, initial encounter: Secondary | ICD-10-CM | POA: Diagnosis not present

## 2022-06-27 MED ORDER — KETOROLAC TROMETHAMINE 30 MG/ML IJ SOLN
INTRAMUSCULAR | Status: AC
  Filled 2022-06-27: qty 1

## 2022-06-27 NOTE — ED Provider Notes (Addendum)
[facility] Provider Note    Event Date/Time   First MD Initiated Contact with Patient 06/27/22 1804     (approximate)   History   Ankle Pain   HPI  Rachel Clayton is a 31 y.o. female who comes in with concern for twisting her ankle about a week ago at the bus stop and still having significant swelling and bruising and pain to the ankle.  She is able to bear some weight but she reports having to walk on the side of her foot so she went to come in and get an x-ray to make sure there is no fracture.  She denies any other injuries from the fall.     Physical Exam   Triage Vital Signs: ED Triage Vitals  Enc Vitals Group     BP 06/27/22 1722 104/87     Pulse Rate 06/27/22 1722 71     Resp 06/27/22 1722 18     Temp 06/27/22 1722 98.3 F (36.8 C)     Temp Source 06/27/22 1722 Oral     SpO2 06/27/22 1722 100 %     Weight 06/27/22 1724 245 lb (111.1 kg)     Height 06/27/22 1805 5\' 4"  (1.626 m)     Head Circumference --      Peak Flow --      Pain Score 06/27/22 1724 1     Pain Loc --      Pain Edu? --      Excl. in [place]? --     Most recent vital signs: Vitals:   06/27/22 1722  BP: 104/87  Pulse: 71  Resp: 18  Temp: 98.3 F (36.8 C)  SpO2: 100%     General: Awake, no distress.  CV:  Good peripheral perfusion.  Resp:  Normal effort.  Abd:  No distention.  Other:  Good distal pulse.  Warm well perfused.  Able to flex and extend the ankle.  Some pain on the lateral malleolus with mild swelling noted and old bruise.  No midfoot tenderness.  No knee tenderness or fibula tenderness. No pain in calf  ED Results / Procedures / Treatments   Labs (all labs ordered are listed, but only abnormal results are displayed) Labs Reviewed - No data to display   RADIOLOGY I have reviewed the xray personally and interpreted and there is no evidence of any fracture  PROCEDURES:  Critical Care performed: No  Procedures   MEDICATIONS ORDERED IN  ED: Medications - No data to display   IMPRESSION / MDM / [place] / ED COURSE  I reviewed the triage vital signs and the nursing notes.   Patient's presentation is most consistent with acute, uncomplicated illness.   Ankle Most Likely DDx: Ankle sprain  DDx that was also considered d/t potential to cause harm, but was found less likely based on history and physical (as detailed above): -Ankle fracture- will use the [place] ankle rules to determine if xray is warranted -Unlikely Maisonneuve fracture given no tenderness at the proximal fibula -Aniceto Boss Fracture-no dorsal midfoot pain (if normal xray and pain here can get weightbearing view to look for joint widening to suggest ligament injury) -Ronnald Ramp fracture-no 5th metatarsal tenderness -Achilles tendon rupture, able to point toes down on their own  X-ray without any evidence of fracture.  Declined ace wrap or cruthces  and can recommend continuing to ice and elevate and she can follow-up with orthopedics as needed    FINAL  CLINICAL IMPRESSION(S) / ED DIAGNOSES   Final diagnoses:  Sprain of right ankle, unspecified ligament, initial encounter     Rx / DC Orders   ED Discharge Orders     None        Note:  This document was prepared using Dragon voice recognition software and may include unintentional dictation errors.   Concha Se, MD 06/27/22 1857    Concha Se, MD 06/27/22 1902

## 2022-06-27 NOTE — Discharge Instructions (Signed)
Call the orthopedic doctors not to be but another 1 to 2 weeks and return to the ER if you develop worsening symptoms or any other concerns

## 2022-06-27 NOTE — ED Provider Triage Note (Signed)
Emergency Medicine Provider Triage Evaluation Note  Rachel Clayton , a 31 y.o. female  was evaluated in triage.  Pt complains of twisted ankle a week ago. Ongoing pain and edema of R ankle.  Review of Systems  Positive: R ankle injury Negative: Other MSK complaints  Physical Exam  There were no vitals taken for this visit. Gen:   Awake, no distress   Resp:  Normal effort  MSK:   Moves extremities without difficulty  Other:    Medical Decision Making  Medically screening exam initiated at 5:23 PM.  Appropriate orders placed.  [employer] was informed that the remainder of the evaluation will be completed by another provider, this initial triage assessment does not replace that evaluation, and the importance of remaining in the ED until their evaluation is complete.  Ankle injury. Xray ordered   Racheal Patches, PA-C 06/27/22 1723

## 2022-06-27 NOTE — ED Triage Notes (Signed)
Pt sts that a week ago she twisted her right ankle on a parking stop. Pt does have swelling and bruising to the ankle.

## 2022-07-15 DIAGNOSIS — Z419 Encounter for procedure for purposes other than remedying health state, unspecified: Secondary | ICD-10-CM | POA: Diagnosis not present

## 2022-08-15 DIAGNOSIS — Z419 Encounter for procedure for purposes other than remedying health state, unspecified: Secondary | ICD-10-CM | POA: Diagnosis not present

## 2022-09-12 IMAGING — DX DG CHEST 1V PORT
1 series · 1 of 1 positions shown · non-contrast
Comparison: 07/17/2011 chest radiograph.

CLINICAL DATA: Fever, cough, body aches, chills

EXAM:
PORTABLE CHEST 1 VIEW

[chest ap]
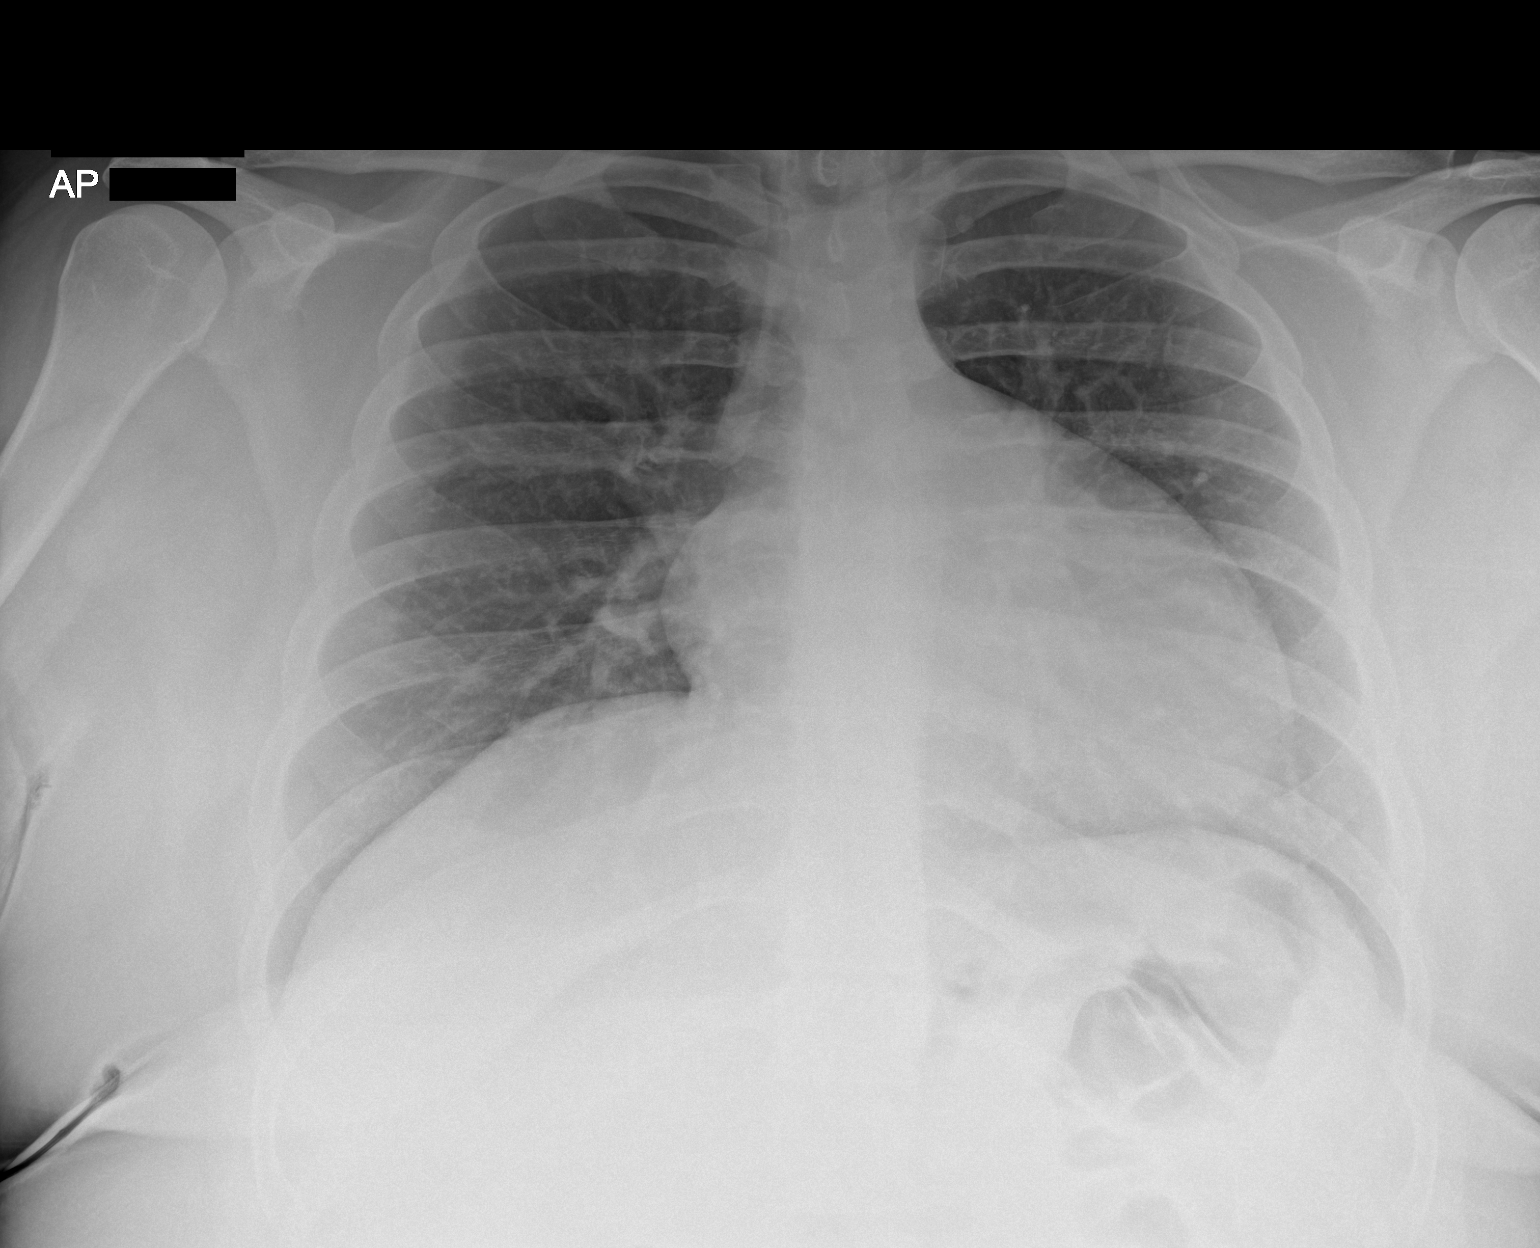

[1 of 1 positions shown; findings below may reference images not displayed]

FINDINGS: Stable cardiomediastinal silhouette with top-normal heart size. No
pneumothorax. No pleural effusion. Lungs appear clear, with no acute
consolidative airspace disease and no pulmonary edema.
IMPRESSION: No active disease.

## 2022-09-15 DIAGNOSIS — Z419 Encounter for procedure for purposes other than remedying health state, unspecified: Secondary | ICD-10-CM | POA: Diagnosis not present

## 2022-10-14 DIAGNOSIS — Z419 Encounter for procedure for purposes other than remedying health state, unspecified: Secondary | ICD-10-CM | POA: Diagnosis not present

## 2022-11-14 DIAGNOSIS — Z419 Encounter for procedure for purposes other than remedying health state, unspecified: Secondary | ICD-10-CM | POA: Diagnosis not present

## 2022-12-14 DIAGNOSIS — Z419 Encounter for procedure for purposes other than remedying health state, unspecified: Secondary | ICD-10-CM | POA: Diagnosis not present

## 2023-01-14 DIAGNOSIS — Z419 Encounter for procedure for purposes other than remedying health state, unspecified: Secondary | ICD-10-CM | POA: Diagnosis not present

## 2023-02-13 DIAGNOSIS — Z419 Encounter for procedure for purposes other than remedying health state, unspecified: Secondary | ICD-10-CM | POA: Diagnosis not present

## 2023-03-16 DIAGNOSIS — Z419 Encounter for procedure for purposes other than remedying health state, unspecified: Secondary | ICD-10-CM | POA: Diagnosis not present

## 2023-04-16 DIAGNOSIS — Z419 Encounter for procedure for purposes other than remedying health state, unspecified: Secondary | ICD-10-CM | POA: Diagnosis not present

## 2023-05-16 DIAGNOSIS — Z419 Encounter for procedure for purposes other than remedying health state, unspecified: Secondary | ICD-10-CM | POA: Diagnosis not present

## 2023-06-12 ENCOUNTER — Inpatient Hospital Stay: Payer: Medicaid Other | Attending: Oncology

## 2023-06-12 ENCOUNTER — Other Ambulatory Visit: Payer: Self-pay

## 2023-06-12 DIAGNOSIS — D509 Iron deficiency anemia, unspecified: Secondary | ICD-10-CM | POA: Insufficient documentation

## 2023-06-12 DIAGNOSIS — D500 Iron deficiency anemia secondary to blood loss (chronic): Secondary | ICD-10-CM

## 2023-06-12 DIAGNOSIS — N281 Cyst of kidney, acquired: Secondary | ICD-10-CM | POA: Insufficient documentation

## 2023-06-12 DIAGNOSIS — K760 Fatty (change of) liver, not elsewhere classified: Secondary | ICD-10-CM | POA: Diagnosis not present

## 2023-06-12 DIAGNOSIS — Z86718 Personal history of other venous thrombosis and embolism: Secondary | ICD-10-CM | POA: Diagnosis not present

## 2023-06-12 LAB — COMPREHENSIVE METABOLIC PANEL
ALT: 43 U/L (ref 0–44)
AST: 33 U/L (ref 15–41)
Albumin: 3.9 g/dL (ref 3.5–5.0)
Alkaline Phosphatase: 55 U/L (ref 38–126)
Anion gap: 7 (ref 5–15)
BUN: 11 mg/dL (ref 6–20)
CO2: 26 mmol/L (ref 22–32)
Calcium: 9.2 mg/dL (ref 8.9–10.3)
Chloride: 102 mmol/L (ref 98–111)
Creatinine, Ser: 0.85 mg/dL (ref 0.44–1.00)
GFR, Estimated: >60 mL/min (ref >60)
Glucose, Bld: 91 mg/dL (ref 70–99)
Potassium: 3.9 mmol/L (ref 3.5–5.1)
Sodium: 135 mmol/L (ref 135–145)
Total Bilirubin: 0.8 mg/dL (ref 0.3–1.2)
Total Protein: 7.9 g/dL (ref 6.5–8.1)

## 2023-06-12 LAB — CBC WITH DIFFERENTIAL (CANCER CENTER ONLY)
Abs Immature Granulocytes: 0.03 10*3/uL (ref 0.00–0.07)
Basophils Absolute: 0.1 10*3/uL (ref 0.0–0.1)
Basophils Relative: 1 %
Eosinophils Absolute: 0.2 10*3/uL (ref 0.0–0.5)
Eosinophils Relative: 2 %
HCT: 43.3 % (ref 36.0–46.0)
Hemoglobin: 14.6 g/dL (ref 12.0–15.0)
Immature Granulocytes: 0 %
Lymphocytes Relative: 25 %
Lymphs Abs: 1.8 10*3/uL (ref 0.7–4.0)
MCH: 29.4 pg (ref 26.0–34.0)
MCHC: 33.7 g/dL (ref 30.0–36.0)
MCV: 87.1 fL (ref 80.0–100.0)
Monocytes Absolute: 0.4 10*3/uL (ref 0.1–1.0)
Monocytes Relative: 6 %
Neutro Abs: 4.8 10*3/uL (ref 1.7–7.7)
Neutrophils Relative %: 66 %
Platelet Count: 265 10*3/uL (ref 150–400)
RBC: 4.97 MIL/uL (ref 3.87–5.11)
RDW: 12.2 % (ref 11.5–15.5)
WBC Count: 7.2 10*3/uL (ref 4.0–10.5)
nRBC: 0 % (ref 0.0–0.2)

## 2023-06-12 LAB — IRON AND TIBC
Iron: 101 ug/dL (ref 28–170)
Saturation Ratios: 24 % (ref 10.4–31.8)
TIBC: 423 ug/dL (ref 250–450)
UIBC: 322 ug/dL

## 2023-06-12 LAB — FERRITIN: Ferritin: 16 ng/mL (ref 11–307)

## 2023-06-15 ENCOUNTER — Inpatient Hospital Stay: Payer: Medicaid Other | Admitting: Oncology

## 2023-06-15 ENCOUNTER — Encounter: Payer: Self-pay | Admitting: Oncology

## 2023-06-15 VITALS — BP 119/67 | HR 73 | Temp 97.8°F | Resp 18 | Wt 257.2 lb

## 2023-06-15 DIAGNOSIS — K760 Fatty (change of) liver, not elsewhere classified: Secondary | ICD-10-CM

## 2023-06-15 DIAGNOSIS — D509 Iron deficiency anemia, unspecified: Secondary | ICD-10-CM | POA: Diagnosis not present

## 2023-06-15 DIAGNOSIS — D500 Iron deficiency anemia secondary to blood loss (chronic): Secondary | ICD-10-CM | POA: Diagnosis not present

## 2023-06-15 DIAGNOSIS — N281 Cyst of kidney, acquired: Secondary | ICD-10-CM | POA: Diagnosis not present

## 2023-06-15 DIAGNOSIS — Z86718 Personal history of other venous thrombosis and embolism: Secondary | ICD-10-CM | POA: Diagnosis not present

## 2023-06-15 NOTE — Assessment & Plan Note (Addendum)
Labs reviewed and discussed with patient. Hemoglobin remains normal Lab Results  Component Value Date   HGB 14.6 06/12/2023   TIBC 423 06/12/2023   IRONPCTSAT 24 06/12/2023   FERRITIN 16 06/12/2023     Iron panel has improved. Recommend patient to continue ferrous sulfate 325 mg every other day.

## 2023-06-15 NOTE — Progress Notes (Signed)
Hematology/Oncology Progress note [phone] [phone]      Patient Care Team: Patient, No Pcp Per as PCP - General (General Practice) Rickard Patience, MD as Consulting Physician (Oncology)  ASSESSMENT & PLAN:   IDA (iron deficiency anemia) Labs reviewed and discussed with patient. Hemoglobin remains normal Lab Results  Component Value Date   HGB 14.6 06/12/2023   TIBC 423 06/12/2023   IRONPCTSAT 24 06/12/2023   FERRITIN 16 06/12/2023     Iron panel has improved. Recommend patient to continue ferrous sulfate 325 mg every other day.   Fatty liver Avoid alcohol, healthy diet and excercise  Recommend patient to establish care with primary care provider.  Local PCP providers list was provided to patient. Orders Placed This Encounter  Procedures   CMP (Cancer Center only)    Standing Status:   Future    Standing Expiration Date:   06/14/2024   CBC with Differential (Cancer Center Only)    Standing Status:   Future    Standing Expiration Date:   06/14/2024   Iron and TIBC    Standing Status:   Future    Standing Expiration Date:   06/14/2024   Ferritin    Standing Status:   Future    Standing Expiration Date:   06/14/2024   1 year follow up All questions were answered. The patient knows to call the clinic with any problems, questions or concerns.  Rickard Patience, MD, PhD [facility] Hematology Oncology 06/15/2023   CHIEF COMPLAINTS/REASON FOR VISIT:  Follow up for Iron deficiency   HISTORY OF PRESENTING ILLNESS:   Rachel Clayton is a  32 y.o.  female with PMH listed below was seen in consultation at the request of  No ref. 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  CT abdomen with concern of bilateral pyelonephritis, she was treated with antibiotics.  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  She was referred to hematology for evaluation.   Patient completed 6 weeks of Xarelto 20 mg daily for provoked ovarian vein thrombosis.  Currently off Xarelto.   Kidney lesion on CT.   06/17/21 MRI abdomen showed 1.5 cm benign Bosniak category 2 hemorrhagic cyst in the upper pole of the left kidney.  No evidence of renal neoplasm.  Hold off additional work-up.  INTERVAL HISTORY Rachel Clayton is a 32 y.o. female who has above history reviewed by me today presents for follow up visit for  iron deficiency anemia and ovarian vein thrombosis  She reports feeling well.  No new complaints.  Denies any abdominal pain.  Review of Systems  Constitutional:  Negative for appetite change, chills, fatigue and fever.  HENT:   Negative for hearing loss and voice change.   Eyes:  Negative for eye problems.  Respiratory:  Negative for chest tightness and cough.   Cardiovascular:  Negative for chest pain.  Gastrointestinal:  Negative for abdominal distention, abdominal pain and blood in stool.  Endocrine: Negative for hot flashes.  Genitourinary:  Negative for difficulty urinating and frequency.   Musculoskeletal:  Negative for arthralgias.  Skin:  Negative for itching and rash.  Neurological:  Negative for extremity weakness.  Hematological:  Negative for adenopathy.  Psychiatric/Behavioral:  Negative for confusion.     MEDICAL HISTORY:  Past Medical History:  Diagnosis Date   Asthma    BMI 40.0-44.9,  adult [facility])    History of blood transfusion 2019   Recurrent UTI     SURGICAL HISTORY: Past Surgical History:  Procedure Laterality Date   CESAREAN SECTION     HAND SURGERY      SOCIAL HISTORY: Social History   Socioeconomic History   Marital status: Single    Spouse name: Not on file   Number of children: 4   Years of education: 8   Highest education level: 8th grade  Occupational History   Not on file  Tobacco Use   Smoking status: Never   Smokeless tobacco:  Never  Vaping Use   Vaping status: Never Used  Substance and Sexual Activity   Alcohol use: Not Currently    Alcohol/week: 2.0 standard drinks of alcohol    Types: 2 Shots of liquor per week    Comment: 3-4x/mo   Drug use: Yes    Types: Marijuana    Comment: last use 12/2020   Sexual activity: Yes    Partners: Male    Birth control/protection: Condom  Other Topics Concern   Not on file  Social History Narrative   Not on file   Social Determinants of Health   Financial Resource Strain: Not on file  Food Insecurity: Not on file  Transportation Needs: Not on file  Physical Activity: Not on file  Stress: Not on file  Social Connections: Not on file  Intimate Partner Violence: Not on file    FAMILY HISTORY: Family History  Problem Relation Age of Onset   Hypertension Mother    Cancer Maternal Grandfather     ALLERGIES:  has No Known Allergies.  MEDICATIONS:  Current Outpatient Medications  Medication Sig Dispense Refill   Ascorbic Acid (VITAMIN C) 500 MG CAPS Take 1 capsule by mouth daily. 90 capsule 1   ferrous sulfate (FERROUSUL) 325 (65 FE) MG tablet Take 1 tablet (325 mg total) by mouth 2 (two) times daily with a meal. 180 tablet 1   ibuprofen (ADVIL) 600 MG tablet Take 1 tablet by mouth every 6 (six) hours as needed.     No current facility-administered medications for this visit.     PHYSICAL EXAMINATION: ECOG PERFORMANCE STATUS: 0 - Asymptomatic Vitals:   06/15/23 1030  BP: 119/67  Pulse: 73  Resp: 18  Temp: 97.8 F (36.6 C)  SpO2: 100%   Filed Weights   06/15/23 1030  Weight: 257 lb 3.2 oz (116.7 kg)    Physical Exam Constitutional:      General: She is not in acute distress. HENT:     Head: Normocephalic and atraumatic.  Eyes:     General: No scleral icterus. Cardiovascular:     Rate and Rhythm: Normal rate and regular rhythm.     Heart sounds: Normal heart sounds.  Pulmonary:     Effort: Pulmonary effort is normal. No respiratory  distress.     Breath sounds: No wheezing.  Abdominal:     General: Bowel sounds are normal. There is no distension.     Palpations: Abdomen is soft.  Musculoskeletal:        General: No deformity. Normal range of motion.     Cervical back: Normal range of motion and neck supple.  Skin:    General: Skin is warm and dry.     Findings: No erythema or rash.  Neurological:     Mental Status: She is alert and oriented to person, place, and time. Mental status is at baseline.  Cranial Nerves: No cranial nerve deficit.     Coordination: Coordination normal.  Psychiatric:        Mood and Affect: Mood normal.     LABORATORY DATA:  I have reviewed the data as listed    Latest Ref Rng & Units 06/12/2023   11:50 AM 06/08/2022    1:01 PM 12/06/2021   12:57 PM  CBC  WBC 4.0 - 10.5 K/uL 7.2  7.8  6.4   Hemoglobin 12.0 - 15.0 g/dL 22.0  25.4  27.0   Hematocrit 36.0 - 46.0 % 43.3  40.3  38.9   Platelets 150 - 400 K/uL 265  258  251       Latest Ref Rng & Units 06/12/2023   11:50 AM 12/06/2021   12:57 PM 05/24/2021   10:55 AM  CMP  Glucose 70 - 99 mg/dL 91  623  98   BUN 6 - 20 mg/dL 11  9  9    Creatinine 0.44 - 1.00 mg/dL 7.62  8.31  5.17   Sodium 135 - 145 mmol/L 135  137  133   Potassium 3.5 - 5.1 mmol/L 3.9  4.1  3.6   Chloride 98 - 111 mmol/L 102  105  102   CO2 22 - 32 mmol/L 26  24  24    Calcium 8.9 - 10.3 mg/dL 9.2  9.1  8.8   Total Protein 6.5 - 8.1 g/dL 7.9  7.1  7.7   Total Bilirubin 0.3 - 1.2 mg/dL 0.8  0.2  0.3   Alkaline Phos 38 - 126 U/L 55  44  48   AST 15 - 41 U/L 33  19  19   ALT 0 - 44 U/L 43  17  18      Iron/TIBC/Ferritin/ %Sat    Component Value Date/Time   IRON 101 06/12/2023 1150   TIBC 423 06/12/2023 1150   FERRITIN 16 06/12/2023 1150   IRONPCTSAT 24 06/12/2023 1150       RADIOGRAPHIC STUDIES: I have personally reviewed the radiological images as listed and agreed with the findings in the report. No results found.

## 2023-06-15 NOTE — Assessment & Plan Note (Addendum)
Avoid alcohol, healthy diet and excercise

## 2023-06-16 DIAGNOSIS — Z419 Encounter for procedure for purposes other than remedying health state, unspecified: Secondary | ICD-10-CM | POA: Diagnosis not present

## 2023-07-16 DIAGNOSIS — Z419 Encounter for procedure for purposes other than remedying health state, unspecified: Secondary | ICD-10-CM | POA: Diagnosis not present

## 2023-08-16 DIAGNOSIS — Z419 Encounter for procedure for purposes other than remedying health state, unspecified: Secondary | ICD-10-CM | POA: Diagnosis not present

## 2023-09-16 DIAGNOSIS — Z419 Encounter for procedure for purposes other than remedying health state, unspecified: Secondary | ICD-10-CM | POA: Diagnosis not present

## 2023-10-14 DIAGNOSIS — Z419 Encounter for procedure for purposes other than remedying health state, unspecified: Secondary | ICD-10-CM | POA: Diagnosis not present

## 2023-10-16 DIAGNOSIS — R059 Cough, unspecified: Secondary | ICD-10-CM | POA: Diagnosis not present

## 2023-10-16 DIAGNOSIS — R0981 Nasal congestion: Secondary | ICD-10-CM | POA: Diagnosis not present

## 2023-10-23 ENCOUNTER — Encounter: Payer: Self-pay | Admitting: Oncology

## 2023-10-24 ENCOUNTER — Ambulatory Visit

## 2023-11-21 ENCOUNTER — Ambulatory Visit: Payer: Medicaid Other | Admitting: Internal Medicine

## 2023-11-21 ENCOUNTER — Encounter: Payer: Self-pay | Admitting: Internal Medicine

## 2023-11-21 ENCOUNTER — Other Ambulatory Visit: Payer: Self-pay

## 2023-11-21 VITALS — BP 122/80 | HR 91 | Temp 98.0°F | Resp 16 | Ht 64.0 in | Wt 264.0 lb

## 2023-11-21 DIAGNOSIS — R7303 Prediabetes: Secondary | ICD-10-CM | POA: Diagnosis not present

## 2023-11-21 DIAGNOSIS — Z13220 Encounter for screening for lipoid disorders: Secondary | ICD-10-CM

## 2023-11-21 DIAGNOSIS — Z86718 Personal history of other venous thrombosis and embolism: Secondary | ICD-10-CM | POA: Insufficient documentation

## 2023-11-21 DIAGNOSIS — D500 Iron deficiency anemia secondary to blood loss (chronic): Secondary | ICD-10-CM | POA: Diagnosis not present

## 2023-11-21 LAB — LIPID PANEL
Cholesterol: 218 mg/dL — ABNORMAL HIGH (ref <200)
HDL: 46 mg/dL — ABNORMAL LOW (ref >=50)
LDL Cholesterol (Calc): 149 mg/dL — ABNORMAL HIGH
Non-HDL Cholesterol (Calc): 172 mg/dL — ABNORMAL HIGH (ref <130)
Total CHOL/HDL Ratio: 4.7 (calc) (ref <5.0)
Triglycerides: 115 mg/dL (ref <150)

## 2023-11-21 LAB — HEMOGLOBIN A1C
Hgb A1c MFr Bld: 5.5 %{Hb} (ref <5.7)
Mean Plasma Glucose: 111 mg/dL
eAG (mmol/L): 6.2 mmol/L

## 2023-11-21 LAB — TSH: TSH: 1.52 m[IU]/L

## 2023-11-21 NOTE — Patient Instructions (Addendum)
 It was great seeing you today!  Plan discussed at today's visit: -Blood work ordered today, results will be uploaded to MyChart.   Follow up in: 1 year or sooner as needed  Take care and let us know if you have any questions or concerns prior to your next visit.  Dr. Caralee Ates  HPV Vaccine Information for Parents  Human papillomavirus (HPV) is a common virus. It spreads easily from person to person through skin-to-skin or sexual contact. There are many types of HPV viruses. Genital or mucosal HPV can cause warts in the genitals. Cutaneous or nonmucosal HPV can cause warts on the hands or feet. Some genital HPV types may cause cancer. Your child can get a shot to help prevent the HPV types that can cause cancer, genital warts, or warts near the opening of the butt (anus). The vaccine is safe and effective. It is recommended that your child get the vaccine at about 72-14 years of age. Getting the vaccine before your child is sexually active gives them the best protection from HPV through adulthood. How can HPV affect my child? An infection with HPV can cause: Genital warts. Mouth or throat cancer. Cancer of the anus. Cancer of the lowest part of the uterus (cervix), outer female genital area (vulva), or vagina. Cancer of the penis (penile cancer). During pregnancy, HPV can be passed to the baby. This infection can cause warts to form in the baby's throat and mouth. What actions can I take to lower my child's risk for HPV? Have your child get the HPV vaccine before they become sexually active. The best time to get the shot is at around 35-80 years of age. The vaccine may be given to children as young as 12 years old.  If your child gets the vaccine before they are 43 years old, it can be given as 2 shots, 6-12 months apart. In some cases, 3 doses are needed. Your child may need 3 doses if: They get the first dose before they are 32 years old but do not have a second dose within 6-12 months after  the first dose. They get their first dose after they are 32 years old. They will need to get the other 2 doses within 6 months of the first dose. They have a weak body defense system (immune system). What are the risks and benefits of the HPV vaccine? Benefits Getting the vaccine can help prevent certain cancers. These include: Oral cancer. This is cancer of the mouth. Anal cancer. This is cancer of the anus. Cancers of the cervix, vulva, and vagina in females. Penile cancer in males. Your child is less likely to get these cancers if they get the vaccine before they become sexually active. The vaccine also prevents genital warts caused by HPV. Risks In rare cases, side effects and reactions have been reported. These include: Soreness, redness, or swelling at the injection site. Dizziness or fainting. Fever. Headache. Muscle or joint pain. Who should not get the HPV vaccine or wait to get it? Some children should not get the HPV vaccine or should wait to get it. Ask the health care provider if your child should get the vaccine if: Your child has had a severe allergic reaction to other vaccines. Your child is allergic to yeast. Your child has a fever. Your child has had a recent illness. Your child is pregnant or may be pregnant. Where to find more information Centers for Disease Control and Prevention (CDC): [employer].no [employer] (  AAP): healthychildren.org This information is not intended to replace advice given to you by your health care provider. Make sure you discuss any questions you have with your health care provider. Document Revised: 04/29/2022 Document Reviewed: 04/29/2022 Elsevier Patient Education  2024 [employer].

## 2023-11-21 NOTE — Progress Notes (Signed)
 New Patient Office Visit  Subjective    Patient ID: Rachel Clayton, female    DOB: 1991/02/09  Age: 33 y.o. MRN: [id number]  CC:  Chief Complaint  Patient presents with   Establish Care    HPI [name] presents to establish care.   Hx of Thrombosis of Ovarian Vein/IDA: -Occurred in 2022, hypercoagulable work up negative -Was on blood thinner for several months which caused significant vaginal bleeding and was then iron deficiency -Now following with Hematology, last labs in October showing hgb 14.6, ferritin 16 -She is supposed to be taking iron supplements but has a hard time remembering and taking inconsistently -No abnormal vaginal bleeding now, periods and regular and light -2021 was blood thinner for a couple   Pre-Diabetes: -A1c 2022 5.7% -Not on medications but has a strong family history of diabetes  Health Maintenance: -Blood work due  -Pap UTD, had at the health department will obtain records -Discussed HPV vaccines, info printed for her to review  Outpatient Encounter Medications as of 11/21/2023  Medication Sig   [DISCONTINUED] ferrous sulfate (FERROUSUL) 325 (65 FE) MG tablet Take 1 tablet (325 mg total) by mouth 2 (two) times daily with a meal.   No facility-administered encounter medications on file as of 11/21/2023.    Past Medical History:  Diagnosis Date   Anemia    Asthma    BMI 40.0-44.9, adult (HCC)    History of blood transfusion 2019   Recurrent UTI    Sepsis (HCC)    Thrombosis    ovary    Past Surgical History:  Procedure Laterality Date   CESAREAN SECTION     HAND SURGERY      Family History  Problem Relation Age of Onset   Kidney disease Mother    Diabetes Mother    Hypertension Mother    Cancer Maternal Grandfather     Social History   Socioeconomic History   Marital status: Single    Spouse name: Not on file   Number of children: 4   Years of education: 8   Highest education level: 8th grade  Occupational History    Not on file  Tobacco Use   Smoking status: Never    Passive exposure: Never   Smokeless tobacco: Never  Vaping Use   Vaping status: Never Used  Substance and Sexual Activity   Alcohol use: Yes    Alcohol/week: 2.0 standard drinks of alcohol    Types: 2 Shots of liquor per week    Comment: 3-4x/mo   Drug use: Not Currently    Types: Marijuana    Comment: last use 12/2020   Sexual activity: Yes    Partners: Male    Birth control/protection: Condom  Other Topics Concern   Not on file  Social History Narrative   Lives at home with her 4 children and work at school system as a Copy   Social Drivers of Corporate investment banker Strain: Not on file  Food Insecurity: Not on file  Transportation Needs: Not on file  Physical Activity: Not on file  Stress: Not on file  Social Connections: Not on file  Intimate Partner Violence: Not on file    Review of Systems  All other systems reviewed and are negative.       Objective    BP 122/80 (Cuff Size: Large)   Pulse 91   Temp 98 F (36.7 C) (Oral)   Resp 16   Ht 5\' 4"  (1.626  m)   Wt 264 lb (119.7 kg)   LMP 11/02/2023   SpO2 98%   BMI 45.32 kg/m   Physical Exam Constitutional:      Appearance: Normal appearance.  HENT:     Head: Normocephalic and atraumatic.  Eyes:     Conjunctiva/sclera: Conjunctivae normal.  Neck:     Comments: No thyromegaly Cardiovascular:     Rate and Rhythm: Normal rate and regular rhythm.  Pulmonary:     Effort: Pulmonary effort is normal.     Breath sounds: Normal breath sounds.  Musculoskeletal:     Cervical back: No tenderness.     Right lower leg: No edema.     Left lower leg: No edema.  Lymphadenopathy:     Cervical: No cervical adenopathy.  Skin:    General: Skin is warm and dry.  Neurological:     General: No focal deficit present.     Mental Status: She is alert. Mental status is at baseline.  Psychiatric:        Mood and Affect: Mood normal.        Behavior:  Behavior normal.         Assessment & Plan:  Iron deficiency anemia due to chronic blood loss Assessment & Plan: Following with Hematology, reviewed labs in October. Hemoglobin improved, ferritin on the lower end of normal. Discussed with the patient continuing the oral iron at least every other day with vitamin C.    History of thrombosis Assessment & Plan: Reviewed labs done at the time, no longer on blood thinner, appeared to be unprovoked.   Orders: -     TSH  Prediabetes Assessment & Plan: Recheck A1c.   Orders: -     Hemoglobin A1c  Lipid screening -     Lipid panel    Return in about 1 year (around 11/20/2024).   Margarita Mail, DO

## 2023-11-21 NOTE — Assessment & Plan Note (Signed)
 Following with Hematology, reviewed labs in October. Hemoglobin improved, ferritin on the lower end of normal. Discussed with the patient continuing the oral iron at least every other day with vitamin C.

## 2023-11-21 NOTE — Assessment & Plan Note (Signed)
 Reviewed labs done at the time, no longer on blood thinner, appeared to be unprovoked.

## 2023-11-21 NOTE — Assessment & Plan Note (Signed)
 Recheck A1c

## 2023-11-22 ENCOUNTER — Encounter: Payer: Self-pay | Admitting: Internal Medicine

## 2023-11-23 ENCOUNTER — Encounter: Payer: Self-pay | Admitting: Internal Medicine

## 2023-11-25 DIAGNOSIS — Z419 Encounter for procedure for purposes other than remedying health state, unspecified: Secondary | ICD-10-CM | POA: Diagnosis not present

## 2023-12-25 DIAGNOSIS — Z419 Encounter for procedure for purposes other than remedying health state, unspecified: Secondary | ICD-10-CM | POA: Diagnosis not present

## 2024-01-19 ENCOUNTER — Encounter

## 2024-01-25 DIAGNOSIS — Z419 Encounter for procedure for purposes other than remedying health state, unspecified: Secondary | ICD-10-CM | POA: Diagnosis not present

## 2024-02-24 DIAGNOSIS — Z419 Encounter for procedure for purposes other than remedying health state, unspecified: Secondary | ICD-10-CM | POA: Diagnosis not present

## 2024-03-26 DIAGNOSIS — Z419 Encounter for procedure for purposes other than remedying health state, unspecified: Secondary | ICD-10-CM | POA: Diagnosis not present

## 2024-04-22 DIAGNOSIS — H5213 Myopia, bilateral: Secondary | ICD-10-CM | POA: Diagnosis not present

## 2024-04-26 DIAGNOSIS — Z419 Encounter for procedure for purposes other than remedying health state, unspecified: Secondary | ICD-10-CM | POA: Diagnosis not present

## 2024-06-10 ENCOUNTER — Inpatient Hospital Stay: Payer: Medicaid Other | Attending: Oncology

## 2024-06-10 DIAGNOSIS — Z862 Personal history of diseases of the blood and blood-forming organs and certain disorders involving the immune mechanism: Secondary | ICD-10-CM | POA: Diagnosis not present

## 2024-06-10 DIAGNOSIS — Z86718 Personal history of other venous thrombosis and embolism: Secondary | ICD-10-CM | POA: Diagnosis not present

## 2024-06-10 DIAGNOSIS — D500 Iron deficiency anemia secondary to blood loss (chronic): Secondary | ICD-10-CM

## 2024-06-10 LAB — CMP (CANCER CENTER ONLY)
ALT: 33 U/L (ref 0–44)
AST: 23 U/L (ref 15–41)
Albumin: 3.7 g/dL (ref 3.5–5.0)
Alkaline Phosphatase: 45 U/L (ref 38–126)
Anion gap: 6 (ref 5–15)
BUN: 10 mg/dL (ref 6–20)
CO2: 24 mmol/L (ref 22–32)
Calcium: 8.7 mg/dL — ABNORMAL LOW (ref 8.9–10.3)
Chloride: 102 mmol/L (ref 98–111)
Creatinine: 0.59 mg/dL (ref 0.44–1.00)
GFR, Estimated: >60 mL/min (ref >60)
Glucose, Bld: 99 mg/dL (ref 70–99)
Potassium: 4.2 mmol/L (ref 3.5–5.1)
Sodium: 132 mmol/L — ABNORMAL LOW (ref 135–145)
Total Bilirubin: 0.5 mg/dL (ref 0.0–1.2)
Total Protein: 7.1 g/dL (ref 6.5–8.1)

## 2024-06-10 LAB — FERRITIN: Ferritin: 15 ng/mL (ref 11–307)

## 2024-06-10 LAB — CBC WITH DIFFERENTIAL (CANCER CENTER ONLY)
Abs Immature Granulocytes: 0.04 K/uL (ref 0.00–0.07)
Basophils Absolute: 0 K/uL (ref 0.0–0.1)
Basophils Relative: 1 %
Eosinophils Absolute: 0.2 K/uL (ref 0.0–0.5)
Eosinophils Relative: 2 %
HCT: 39.8 % (ref 36.0–46.0)
Hemoglobin: 13.7 g/dL (ref 12.0–15.0)
Immature Granulocytes: 1 %
Lymphocytes Relative: 28 %
Lymphs Abs: 2.2 K/uL (ref 0.7–4.0)
MCH: 29.9 pg (ref 26.0–34.0)
MCHC: 34.4 g/dL (ref 30.0–36.0)
MCV: 86.9 fL (ref 80.0–100.0)
Monocytes Absolute: 0.4 K/uL (ref 0.1–1.0)
Monocytes Relative: 5 %
Neutro Abs: 5 K/uL (ref 1.7–7.7)
Neutrophils Relative %: 63 %
Platelet Count: 244 K/uL (ref 150–400)
RBC: 4.58 MIL/uL (ref 3.87–5.11)
RDW: 12 % (ref 11.5–15.5)
WBC Count: 7.8 K/uL (ref 4.0–10.5)
nRBC: 0 % (ref 0.0–0.2)

## 2024-06-10 LAB — IRON AND TIBC
Iron: 92 ug/dL (ref 28–170)
Saturation Ratios: 23 % (ref 10.4–31.8)
TIBC: 403 ug/dL (ref 250–450)
UIBC: 311 ug/dL

## 2024-06-14 ENCOUNTER — Inpatient Hospital Stay: Payer: Medicaid Other | Admitting: Oncology

## 2024-06-14 ENCOUNTER — Encounter: Payer: Self-pay | Admitting: Oncology

## 2024-06-14 VITALS — BP 115/86 | HR 73 | Temp 98.0°F | Resp 18 | Wt 269.6 lb

## 2024-06-14 DIAGNOSIS — D500 Iron deficiency anemia secondary to blood loss (chronic): Secondary | ICD-10-CM

## 2024-06-14 DIAGNOSIS — Z86718 Personal history of other venous thrombosis and embolism: Secondary | ICD-10-CM | POA: Diagnosis not present

## 2024-06-14 DIAGNOSIS — Z862 Personal history of diseases of the blood and blood-forming organs and certain disorders involving the immune mechanism: Secondary | ICD-10-CM | POA: Diagnosis not present

## 2024-06-14 NOTE — Progress Notes (Signed)
 Hematology/Oncology Progress note [phone] [phone]      Patient Care Team: Bernardo Fend, DO as PCP - General (Internal Medicine) Babara Call, MD as Consulting Physician (Oncology)  ASSESSMENT & PLAN:   IDA (iron deficiency anemia) Labs reviewed and discussed with patient. Hemoglobin remains normal Lab Results  Component Value Date   HGB 13.7 06/10/2024   TIBC 403 06/10/2024   IRONPCTSAT 23 06/10/2024   FERRITIN 15 06/10/2024     Both iron panel and hemoglobin levels are normalized.  She takes multivitamin that contains iron as maintenance   Patient is discharged from my clinic. I recommend patient to continue follow up with primary care physician. Patient may re-establish care in the future if clinically indicated.   All questions were answered. The patient knows to call the clinic with any problems, questions or concerns.  Call Babara, MD, PhD [facility] Hematology Oncology 06/14/2024   CHIEF COMPLAINTS/REASON FOR VISIT:  Follow up for Iron deficiency   HISTORY OF PRESENTING ILLNESS:   Rachel Clayton is a  33 y.o.  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  CT abdomen with concern of bilateral pyelonephritis, she was treated with antibiotics.  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  She was referred to hematology for evaluation.   Patient completed 6 weeks of Xarelto  20 mg daily for provoked ovarian vein thrombosis.  Currently off Xarelto .   Kidney lesion on CT.   06/17/21 MRI abdomen showed 1.5 cm benign Bosniak category 2 hemorrhagic cyst in the upper pole of the left kidney.  No evidence of renal neoplasm.  Hold off additional work-up.  INTERVAL HISTORY Rachel Clayton is a 33 y.o.  female who has above history reviewed by me today presents for follow up visit for  iron deficiency anemia and ovarian vein thrombosis  She reports feeling well.  No new complaints.  Denies any abdominal pain.  Review of Systems  Constitutional:  Negative for appetite change, chills, fatigue and fever.  HENT:   Negative for hearing loss and voice change.   Eyes:  Negative for eye problems.  Respiratory:  Negative for chest tightness and cough.   Cardiovascular:  Negative for chest pain.  Gastrointestinal:  Negative for abdominal distention, abdominal pain and blood in stool.  Endocrine: Negative for hot flashes.  Genitourinary:  Negative for difficulty urinating and frequency.   Musculoskeletal:  Negative for arthralgias.  Skin:  Negative for itching and rash.  Neurological:  Negative for extremity weakness.  Hematological:  Negative for adenopathy.  Psychiatric/Behavioral:  Negative for confusion.     MEDICAL HISTORY:  Past Medical History:  Diagnosis Date   Anemia    Asthma    BMI 40.0-44.9, adult (HCC)    History of blood transfusion 2019   Recurrent UTI    Sepsis (HCC)    Thrombosis    ovary    SURGICAL HISTORY: Past Surgical History:  Procedure Laterality Date   CESAREAN SECTION     HAND SURGERY      SOCIAL HISTORY: Social History   Socioeconomic History   Marital status: Single    Spouse name: Not on file   Number of children: 4   Years of education: 8   Highest education level: 8th grade  Occupational  History   Not on file  Tobacco Use   Smoking status: Never    Passive exposure: Never   Smokeless tobacco: Never  Vaping Use   Vaping status: Never Used  Substance and Sexual Activity   Alcohol use: Yes    Alcohol/week: 2.0 standard drinks of alcohol    Types: 2 Shots of liquor per week    Comment: 3-4x/mo   Drug use: Not Currently    Types: Marijuana    Comment: last use 12/2020   Sexual activity: Yes    Partners: Male    Birth control/protection:  Condom  Other Topics Concern   Not on file  Social History Narrative   Lives at home with her 4 children and 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  MEDICATIONS:  Current Outpatient Medications  Medication Sig Dispense Refill   Multiple Vitamin (MULTIVITAMIN) tablet Take 1 tablet by mouth daily.     No current facility-administered medications for this visit.     PHYSICAL EXAMINATION: ECOG PERFORMANCE STATUS: 0 - Asymptomatic Vitals:   06/14/24 1004  BP: 115/86  Pulse: 73  Resp: 18  Temp: 98 F (36.7 C)   Filed Weights   06/14/24 1004  Weight: 269 lb 9.6 oz (122.3 kg)    Physical Exam Constitutional:      General: She is not in acute distress. HENT:     Head: Normocephalic and atraumatic.  Eyes:     General: No scleral icterus. Cardiovascular:     Rate and Rhythm: Normal rate and regular rhythm.     Heart sounds: Normal heart sounds.  Pulmonary:     Effort: Pulmonary effort is normal. No respiratory distress.     Breath sounds: No wheezing.  Abdominal:     General: Bowel sounds are normal. There is no distension.     Palpations: Abdomen is soft.  Musculoskeletal:        General: No deformity. Normal range of motion.     Cervical back: Normal range of motion and neck supple.  Skin:    General: Skin is warm and dry.     Findings: No erythema or rash.  Neurological:     Mental Status: She is alert and oriented to person, place, and time. Mental status is at baseline.     Cranial Nerves: No  cranial nerve deficit.     Coordination: Coordination normal.  Psychiatric:        Mood and Affect: Mood normal.     LABORATORY DATA:  I have reviewed the data as listed    Latest Ref Rng & Units 06/10/2024    9:54 AM 06/12/2023   11:50 AM 06/08/2022    1:01 PM  CBC  WBC 4.0 - 10.5 K/uL 7.8  7.2  7.8   Hemoglobin 12.0 - 15.0 g/dL 86.2  85.3  86.4   Hematocrit 36.0 - 46.0 % 39.8  43.3  40.3   Platelets 150 - 400 K/uL  244  265  258       Latest Ref Rng & Units 06/10/2024    9:54 AM 06/12/2023   11:50 AM 12/06/2021   12:57 PM  CMP  Glucose 70 - 99 mg/dL 99  91  890   BUN 6 - 20 mg/dL 10  11  9    Creatinine 0.44 - 1.00 mg/dL 9.40  9.14  9.20   Sodium 135 - 145 mmol/L 132  135  137   Potassium 3.5 - 5.1 mmol/L 4.2  3.9  4.1   Chloride 98 - 111 mmol/L 102  102  105   CO2 22 - 32 mmol/L 24  26  24    Calcium 8.9 - 10.3 mg/dL 8.7  9.2  9.1   Total Protein 6.5 - 8.1 g/dL 7.1  7.9  7.1   Total Bilirubin 0.0 - 1.2 mg/dL 0.5  0.8  0.2   Alkaline Phos 38 - 126 U/L 45  55  44   AST 15 - 41 U/L 23  33  19   ALT 0 - 44 U/L 33  43  17      Iron/TIBC/Ferritin/ %Sat    Component Value Date/Time   IRON 92 06/10/2024 0954   TIBC 403 06/10/2024 0954   FERRITIN 15 06/10/2024 0954   IRONPCTSAT 23 06/10/2024 0954       RADIOGRAPHIC STUDIES: I have personally reviewed the radiological images as listed and agreed with the findings in the report. No results found.

## 2024-06-14 NOTE — Assessment & Plan Note (Addendum)
 Labs reviewed and discussed with patient. Hemoglobin remains normal Lab Results  Component Value Date   HGB 13.7 06/10/2024   TIBC 403 06/10/2024   IRONPCTSAT 23 06/10/2024   FERRITIN 15 06/10/2024     Both iron panel and hemoglobin levels are normalized.  She takes multivitamin that contains iron as maintenance

## 2024-06-26 DIAGNOSIS — Z419 Encounter for procedure for purposes other than remedying health state, unspecified: Secondary | ICD-10-CM | POA: Diagnosis not present

## 2024-07-30 DIAGNOSIS — J020 Streptococcal pharyngitis: Secondary | ICD-10-CM | POA: Diagnosis not present

## 2024-11-21 ENCOUNTER — Ambulatory Visit: Admitting: Internal Medicine

## 2024-11-26 ENCOUNTER — Ambulatory Visit: Admitting: Internal Medicine
